# Patient Record
Sex: Male | Born: 1980 | Race: White | Hispanic: No | Marital: Single | Smoking: Never smoker
Health system: Southern US, Community
[De-identification: ages and names within clinical notes are randomized; demographics above are authoritative.]

---

## 2019-07-03 ENCOUNTER — Ambulatory Visit (INDEPENDENT_AMBULATORY_CARE_PROVIDER_SITE_OTHER): Payer: BLUE CROSS/BLUE SHIELD

## 2019-07-03 ENCOUNTER — Other Ambulatory Visit: Payer: Self-pay

## 2019-07-03 ENCOUNTER — Ambulatory Visit: Payer: BLUE CROSS/BLUE SHIELD | Admitting: Podiatry

## 2019-07-03 DIAGNOSIS — M2142 Flat foot [pes planus] (acquired), left foot: Secondary | ICD-10-CM | POA: Diagnosis not present

## 2019-07-03 DIAGNOSIS — M2141 Flat foot [pes planus] (acquired), right foot: Secondary | ICD-10-CM

## 2019-07-03 DIAGNOSIS — M779 Enthesopathy, unspecified: Secondary | ICD-10-CM

## 2019-07-03 DIAGNOSIS — M7752 Other enthesopathy of left foot: Secondary | ICD-10-CM | POA: Diagnosis not present

## 2019-07-05 NOTE — Progress Notes (Signed)
Subjective:   Patient ID: Adam Garza, male   DOB: 38 y.o.   MRN: [id number]   HPI 38 year old male presents the office today for concerns of pain to the left foot which is been ongoing since July.  He denies any recent injury to the area and the pain is intermittent.  He states he is quite.  Not sure if that is contributing to his pain.  It did seem that the pain started when he was squatting more while working on the house.  He states that shoes make it feel better.  Walking on hardwood floors makes the pain worse.  No swelling.  No numbness or tingling or any radiating pain.  No other concerns.   Review of Systems  All other systems reviewed and are negative.  No past medical history on file.  No current outpatient medications on file.  Allergies  Allergen Reactions  . Penicillins     Had reaction and 38 years old, unknown reaction.      Objective:  Physical Exam   General: AAO x3, NAD  Dermatological: Skin is warm, dry and supple bilateral. Nails x 10 are well manicured; remaining integument appears unremarkable at this time. There are no open sores, no preulcerative lesions, no rash or signs of infection present.  Vascular: Dorsalis Pedis artery and Posterior Tibial artery pedal pulses are 2/4 bilateral with immedate capillary fill time. Pedal hair growth present. No varicosities and no lower extremity edema present bilateral. There is no pain with calf compression, swelling, warmth, erythema.   Neruologic: Grossly intact via light touch bilateral. Vibratory intact via tuning fork bilateral. Protective threshold with Semmes Wienstein monofilament intact to all pedal sites bilateral.   Musculoskeletal: There is a decrease in the arch upon weightbearing bilaterally with left side slightly worse than the right.  There is tenderness palpation on the dorsal aspect of the foot mostly on the Lisfranc joint along the second metatarsal cuneiform joint.  Small palpable bone spurs present.   There is no other areas of pinpoint tenderness.  Ankle, subtalar range of motion intact.  Muscular strength 5/5 in all groups tested bilateral.  Gait: Unassisted, Nonantalgic.       Assessment:   Capsulitis, tendinitis left foot likely due to flatfoot deformity     Plan:  -Treatment options discussed including all alternatives, risks, and complications -Etiology of symptoms were discussed -X-rays were obtained and reviewed with the patient. There is no evidence of acute fracture or stress fracture. -Steroid injection performed.  Skin was cleaned with alcohol and a mixture of 1 cc Kenalog 10, 0.5 cc of Marcaine plain and 0.5 cc of lidocaine plain was infiltrated on the midfoot on the left side without any complications over the area of maximal tenderness.  He tolerated well. -This measured orthotics today by Liliane Channel. Will check orthotic coverage at his request.   Return in about 3 weeks (around 07/24/2019).  Trula Slade DPM

## 2019-07-24 ENCOUNTER — Ambulatory Visit (INDEPENDENT_AMBULATORY_CARE_PROVIDER_SITE_OTHER): Payer: BLUE CROSS/BLUE SHIELD | Admitting: Orthotics

## 2019-07-24 ENCOUNTER — Other Ambulatory Visit: Payer: Self-pay

## 2019-07-24 DIAGNOSIS — M779 Enthesopathy, unspecified: Secondary | ICD-10-CM

## 2019-07-24 DIAGNOSIS — M2142 Flat foot [pes planus] (acquired), left foot: Secondary | ICD-10-CM | POA: Diagnosis not present

## 2019-07-24 DIAGNOSIS — M2141 Flat foot [pes planus] (acquired), right foot: Secondary | ICD-10-CM | POA: Diagnosis not present

## 2019-07-24 NOTE — Progress Notes (Signed)
Patient came in today to pick up custom made foot orthotics.  The goals were accomplished and the patient reported no dissatisfaction with said orthotics.  Patient was advised of breakin period and how to report any issues. 

## 2019-10-31 ENCOUNTER — Other Ambulatory Visit: Payer: Self-pay

## 2019-11-01 ENCOUNTER — Encounter: Payer: Self-pay | Admitting: Family Medicine

## 2019-11-01 ENCOUNTER — Ambulatory Visit (INDEPENDENT_AMBULATORY_CARE_PROVIDER_SITE_OTHER): Payer: BLUE CROSS/BLUE SHIELD | Admitting: Family Medicine

## 2019-11-01 VITALS — BP 100/74 | HR 71 | Temp 97.5°F | Ht 70.0 in | Wt 212.0 lb

## 2019-11-01 DIAGNOSIS — Z23 Encounter for immunization: Secondary | ICD-10-CM

## 2019-11-01 DIAGNOSIS — M5416 Radiculopathy, lumbar region: Secondary | ICD-10-CM | POA: Diagnosis not present

## 2019-11-01 DIAGNOSIS — F319 Bipolar disorder, unspecified: Secondary | ICD-10-CM | POA: Diagnosis not present

## 2019-11-01 MED ORDER — PREDNISONE 10 MG (21) PO TBPK: ORAL_TABLET | ORAL | 0 refills | Status: DC

## 2019-11-01 MED ORDER — KETOROLAC TROMETHAMINE 60 MG/2ML IM SOLN
60.0000 mg | Freq: Once | INTRAMUSCULAR | Status: AC
  Administered 2019-11-01: 09:00:00 60 mg via INTRAMUSCULAR

## 2019-11-01 MED ORDER — CYCLOBENZAPRINE HCL 10 MG PO TABS: 10.0000 mg | ORAL_TABLET | Freq: Three times a day (TID) | ORAL | 0 refills | Status: DC | PRN

## 2019-11-01 NOTE — Progress Notes (Signed)
New Patient Office Visit  Subjective:  Patient ID: Adam Garza, male    DOB: 1981-03-07  Age: 39 y.o. MRN: [id number]  CC:  Chief Complaint  Patient presents with  . Establish Care    c/o back pains that radiates to his left leg x 3 days.     HPI Adam Garza presents for evaluation and treatment of a 3-day history of severe lower back pain.  Pain is in the lower back and is currently radiating into his left buttock area.  It is painful for him to bear weight on his left leg.  He had had pain in his left calf with tingling.  That has since resolved.  This came about after he was dead lifting in his basement gym.  Denies changes in his bowel or bladder function.  Denies saddle paresthesias.  History of lower back strains in the past but have not been this severe.  He has been taking ibuprofen with minimal relief.  Significant past medical history stable bipolar 2 treated with Lamictal.  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  He is down here with his significant other who is also a professor.  He has been teaching remotely.  History reviewed. No pertinent past medical history.  History reviewed. No pertinent surgical history.  Family History  Problem Relation Age of Onset  . Cancer Mother   . Arthritis Paternal Grandmother     Social History   Socioeconomic History  . Marital status: Single    Spouse name: Not on file  . Number of children: Not on file  . Years of education: Not on file  . Highest education level: Not on file  Occupational History  . Not on file  Tobacco Use  . Smoking status: Never Smoker  . Smokeless tobacco: Never Used  Substance and Sexual Activity  . Alcohol use: Not Currently    Comment: social  . Drug use: Never  . Sexual activity: Yes  Other Topics Concern  . Not on file  Social History Narrative  . Not on file   Social Determinants of Health   Financial Resource Strain:   . Difficulty of Paying Living Expenses: Not  on file  Food Insecurity:   . Worried About Programme researcher, broadcasting/film/video in the Last Year: Not on file  . Ran Out of Food in the Last Year: Not on file  Transportation Needs:   . Lack of Transportation (Medical): Not on file  . Lack of Transportation (Non-Medical): Not on file  Physical Activity:   . Days of Exercise per Week: Not on file  . Minutes of Exercise per Session: Not on file  Stress:   . Feeling of Stress : Not on file  Social Connections:   . Frequency of Communication with Friends and Family: Not on file  . Frequency of Social Gatherings with Friends and Family: Not on file  . Attends Religious Services: Not on file  . Active Member of Clubs or Organizations: Not on file  . Attends Banker Meetings: Not on file  . Marital Status: Not on file  Intimate Partner Violence:   . Fear of Current or Ex-Partner: Not on file  . Emotionally Abused: Not on file  . Physically Abused: Not on file  . Sexually Abused: Not on file    ROS Review of Systems  Constitutional: Negative.   Respiratory: Negative.   Cardiovascular: Negative.   Gastrointestinal: Negative.   Genitourinary: Negative.  Musculoskeletal: Positive for back pain and myalgias.  Skin: Negative.   Allergic/Immunologic: Negative for immunocompromised state.  Neurological: Negative for weakness and numbness.  Psychiatric/Behavioral: Negative.     Objective:   Today's Vitals: BP 100/74   Pulse 71   Temp (!) 97.5 F (36.4 C) (Oral)   Ht 5\' 10"  (1.778 m)   Wt 212 lb (96.2 kg)   SpO2 98%   BMI 30.42 kg/m   Physical Exam Vitals and nursing note reviewed.  Constitutional:      General: He is not in acute distress.    Appearance: Normal appearance. He is normal weight. He is not ill-appearing, toxic-appearing or diaphoretic.  HENT:     Head: Normocephalic and atraumatic.     Right Ear: External ear normal.     Left Ear: External ear normal.  Eyes:     General:        Right eye: No discharge.         Left eye: No discharge.     Conjunctiva/sclera: Conjunctivae normal.  Pulmonary:     Effort: Pulmonary effort is normal.  Musculoskeletal:     Lumbar back: No deformity or bony tenderness. Decreased range of motion. Positive left straight leg raise test (pain in lower back with slr). Negative right straight leg raise test.  Skin:    General: Skin is warm and dry.  Neurological:     Mental Status: He is alert and oriented to person, place, and time.     Motor: Motor function is intact.     Deep Tendon Reflexes:     Reflex Scores:      Patellar reflexes are 1+ on the right side and 1+ on the left side.      Achilles reflexes are 1+ on the right side and 1+ on the left side.    Comments: Mild sciatic notch ttp.   Psychiatric:        Mood and Affect: Mood normal.        Behavior: Behavior normal.     Assessment & Plan:   Problem List Items Addressed This Visit      Nervous and Auditory   Lumbar radiculopathy, acute - Primary   Relevant Medications   lamoTRIgine (LAMICTAL) 150 MG tablet   ketorolac (TORADOL) injection 60 mg (Start on 11/01/2019  9:15 AM)   predniSONE (STERAPRED UNI-PAK 21 TAB) 10 MG (21) TBPK tablet   cyclobenzaprine (FLEXERIL) 10 MG tablet     Other   Bipolar disorder [facility])      Outpatient Encounter Medications as of 11/01/2019  Medication Sig  . cyclobenzaprine (FLEXERIL) 10 MG tablet Take 1 tablet (10 mg total) by mouth 3 (three) times daily as needed for muscle spasms.  Marland Kitchen lamoTRIgine (LAMICTAL) 150 MG tablet Take 150 mg by mouth daily.  . predniSONE (STERAPRED UNI-PAK 21 TAB) 10 MG (21) TBPK tablet Take 6 today, 5 tomorrow, 4 the next day and then 3, 2, 1 and stop   Facility-Administered Encounter Medications as of 11/01/2019  Medication  . ketorolac (TORADOL) injection 60 mg    Follow-up: Return in about 1 week (around 11/08/2019), or if symptoms worsen or fail to improve.   Libby Maw, MD

## 2019-11-01 NOTE — Patient Instructions (Signed)
Radicular Pain Radicular pain is a type of pain that spreads from your back or neck along a spinal nerve. Spinal nerves are nerves that leave the spinal cord and go to the muscles. Radicular pain is sometimes called radiculopathy, radiculitis, or a pinched nerve. When you have this type of pain, you may also have weakness, numbness, or tingling in the area of your body that is supplied by the nerve. The pain may feel sharp and burning. Depending on which spinal nerve is affected, the pain may occur in the:  Neck area (cervical radicular pain). You may also feel pain, numbness, weakness, or tingling in the arms.  Mid-spine area (thoracic radicular pain). You would feel this pain in the back and chest. This type is rare.  Lower back area (lumbar radicular pain). You would feel this pain as low back pain. You may feel pain, numbness, weakness, or tingling in the buttocks or legs. Sciatica is a type of lumbar radicular pain that shoots down the back of the leg. Radicular pain occurs when one of the spinal nerves becomes irritated or squeezed (compressed). It is often caused by something pushing on a spinal nerve, such as one of the bones of the spine (vertebrae) or one of the round cushions between vertebrae (intervertebral disks). This can result from:  An injury.  Wear and tear or aging of a disk.  The growth of a bone spur that pushes on the nerve. Radicular pain often goes away when you follow instructions from your health care provider for relieving pain at home. Follow these instructions at home: Managing pain      If directed, put ice on the affected area: ? Put ice in a plastic bag. ? Place a towel between your skin and the bag. ? Leave the ice on for 20 minutes, 2-3 times a day.  If directed, apply heat to the affected area as often as told by your health care provider. Use the heat source that your health care provider recommends, such as a moist heat pack or a heating pad. ? Place  a towel between your skin and the heat source. ? Leave the heat on for 20-30 minutes. ? Remove the heat if your skin turns bright red. This is especially important if you are unable to feel pain, heat, or cold. You may have a greater risk of getting burned. Activity   Do not sit or rest in bed for long periods of time.  Try to stay as active as possible. Ask your health care provider what type of exercise or activity is best for you.  Avoid activities that make your pain worse, such as bending and lifting.  Do not lift anything that is heavier than 10 lb (4.5 kg), or the limit that you are told, until your health care provider says that it is safe.  Practice using proper technique when lifting items. Proper lifting technique involves bending your knees and rising up.  Do strength and range-of-motion exercises only as told by your health care provider or physical therapist. General instructions  Take over-the-counter and prescription medicines only as told by your health care provider.  Pay attention to any changes in your symptoms.  Keep all follow-up visits as told by your health care provider. This is important. ? Your health care provider may send you to a physical therapist to help with this pain. Contact a health care provider if:  Your pain and other symptoms get worse.  Your pain medicine is not   helping.  Your pain has not improved after a few weeks of home care.  You have a fever. Get help right away if:  You have severe pain, weakness, or numbness.  You have difficulty with bladder or bowel control. Summary  Radicular pain is a type of pain that spreads from your back or neck along a spinal nerve.  When you have radicular pain, you may also have weakness, numbness, or tingling in the area of your body that is supplied by the nerve.  The pain may feel sharp or burning.  Radicular pain may be treated with ice, heat, medicines, or physical therapy. This  information is not intended to replace advice given to you by your health care provider. Make sure you discuss any questions you have with your health care provider. Document Revised: 04/25/2018 Document Reviewed: 04/25/2018 Elsevier Patient Education  [place] Nerve A pinched nerve is an injury that occurs when too much pressure is placed on a nerve. This pressure can cause pain, burning, and muscle weakness in places that the nerve supplies feeling to, such as an arm, hand, or leg, or the back or neck. If a nerve is severely pinched or has been pinched for a long time, permanent nerve damage can occur. What are the causes? This condition may be caused by:  A nerve passing through a narrow area between bones or other body structures.  Arthritis that causes bones to press on a nerve.  Loss of blood supply to a nerve.  A nerve being stretched from an injury.  A sudden injury with swelling.  Long-term wear on the nerve.  Age-related changes in the spine. What are the signs or symptoms? The most common symptoms of a pinched nerve are feeling a tingling sensation and numbness. Other symptoms include:  Pain that spreads from one area of the body part to another.  A burning feeling.  Muscle weakness. How is this diagnosed? This condition may be diagnosed based on:  A physical exam. During the exam, your health care provider will: ? Check for numbness and muscle weakness. ? Move affected body parts to test for pain.  X-rays to check for bone damage.  A MRI or CT scan to check for conditions that may be causing nerve damage.  A muscle test (electromyogram, EMG) to evaluate how your muscles and nerves communicate. How is this treated?     A pinched nerve is usually treated first by:  Resting the affected body area.  Using devices to help you move without pain (supportive or protective devices), such as a splint, brace, or neck collar. Other treatments  depend on your symptoms and the amount of nerve damage you have. Other treatments may include:  Medicines, such as: ? Injections of numbing medicine. ? NSAIDs. ? Pain medicines. ? Steroid medicines. These may be given as a pill or as an injection.  Physical therapy to relieve pain, maintain movement, and improve muscle strength.  Surgery. This may be done if other treatments do not work. Follow these instructions at home:      Take over-the-counter and prescription medicines only as told by your health care provider.  Wear supportive or protective devices as told by your health care provider.  Do physical therapy exercises as directed.  Ask your health care provider what activities are safe for you.  Rest as needed.  If directed, put ice on the affected area: ? Put ice in a plastic bag. ? Place a towel  between your skin and the bag. ? Leave the ice on for 20 minutes, 2-3 times a day.  If directed, apply heat to the affected area. Use the heat source that your health care provider recommends, such as a moist heat pack or a heating pad. ? Place a towel between your skin and the heat source. ? Leave the heat on for 20-30 minutes. ? Remove the heat if your skin turns bright red. This is especially important if you are unable to feel pain, heat, or cold. You may have a greater risk of getting burned.  Do not drive or use heavy machinery while taking prescription pain medicine.  Keep all follow-up visits as told by your health care provider. This is important. Contact a health care provider if:  Your condition does not improve with treatment.  Your pain, numbness, or weakness suddenly gets worse. Get help right away if you:  Have loss of bladder control (urinary incontinence) or you cannot urinate.  Cannot control bowel movements (fecal incontinence).  Have new weakness in your arms or legs. Summary  A pinched nerve is an injury that occurs when too much pressure is  placed on a nerve.  This pressure can cause pain, burning, and muscle weakness in places that the nerve supplies feeling to, such as an arm, hand, or leg, or the back or neck.  Take over-the-counter and prescription medicines only as told by your health care provider.  Ask your health care provider what activities are safe for you while you are having symptoms. This information is not intended to replace advice given to you by your health care provider. Make sure you discuss any questions you have with your health care provider. Document Revised: 10/28/2017 Document Reviewed: 10/25/2017 Elsevier Patient Education  2020 [employer].

## 2019-11-02 ENCOUNTER — Telehealth: Payer: Self-pay | Admitting: Family Medicine

## 2019-11-02 DIAGNOSIS — M5416 Radiculopathy, lumbar region: Secondary | ICD-10-CM

## 2019-11-02 MED ORDER — TRAMADOL HCL 50 MG PO TABS: 50.0000 mg | ORAL_TABLET | Freq: Three times a day (TID) | ORAL | 0 refills | Status: AC | PRN

## 2019-11-02 NOTE — Addendum Note (Signed)
Addended by: Andrez Grime on: 11/02/2019 09:49 AM   Modules accepted: Orders

## 2019-11-02 NOTE — Telephone Encounter (Signed)
Dr. Kremer, please advise 

## 2019-11-02 NOTE — Telephone Encounter (Signed)
Patient was prescribed medication for back pain and received and injection yesterday. Patient wife stated that patient is still in extreme pain and wants to know how long does it for medication to work.

## 2019-11-02 NOTE — Telephone Encounter (Signed)
Pt aware that Rx sent in and will go pick it up, Will call back if no improvement.

## 2019-11-06 ENCOUNTER — Telehealth: Payer: Self-pay | Admitting: Family Medicine

## 2019-11-06 DIAGNOSIS — M5416 Radiculopathy, lumbar region: Secondary | ICD-10-CM

## 2019-11-06 MED ORDER — HYDROCODONE-ACETAMINOPHEN 5-325 MG PO TABS: 1.0000 | ORAL_TABLET | Freq: Four times a day (QID) | ORAL | 0 refills | Status: DC | PRN

## 2019-11-06 NOTE — Telephone Encounter (Signed)
Have sent a stronger pain med to the pharmacy for him. Yes, I would like to see him next week. If this med doesn't help him, I would want for him to go to the emergency room.

## 2019-11-06 NOTE — Telephone Encounter (Signed)
Spoke patient who states that his pain seems to be subsiding a little but he still having extreme pain in his leg to where he can barley walk. Patient wanting to know if he could have a few more days of pain medication and and reschedule his appointment to one day next week? Please advise.

## 2019-11-06 NOTE — Telephone Encounter (Signed)
Patient is calling to refill on all the meds for his pain. Patient stated that there has been no change and wants to know if it is okay to rescheduled his appointment for next week. Please call patient at [phone].

## 2019-11-07 NOTE — Telephone Encounter (Signed)
Pt aware of message and have rescheduled appt to next week.

## 2019-11-08 ENCOUNTER — Ambulatory Visit: Payer: BLUE CROSS/BLUE SHIELD | Admitting: Family Medicine

## 2019-11-12 ENCOUNTER — Other Ambulatory Visit: Payer: Self-pay

## 2019-11-13 ENCOUNTER — Ambulatory Visit (INDEPENDENT_AMBULATORY_CARE_PROVIDER_SITE_OTHER): Payer: BLUE CROSS/BLUE SHIELD | Admitting: Family Medicine

## 2019-11-13 ENCOUNTER — Encounter: Payer: Self-pay | Admitting: Family Medicine

## 2019-11-13 VITALS — BP 118/78 | HR 119 | Temp 97.0°F | Ht 70.0 in | Wt 222.2 lb

## 2019-11-13 DIAGNOSIS — M5126 Other intervertebral disc displacement, lumbar region: Secondary | ICD-10-CM

## 2019-11-13 DIAGNOSIS — M5416 Radiculopathy, lumbar region: Secondary | ICD-10-CM | POA: Diagnosis not present

## 2019-11-13 MED ORDER — HYDROCODONE-ACETAMINOPHEN 5-325 MG PO TABS: 1.0000 | ORAL_TABLET | Freq: Four times a day (QID) | ORAL | 0 refills | Status: DC | PRN

## 2019-11-13 MED ORDER — PREDNISONE 10 MG (48) PO TBPK: ORAL_TABLET | ORAL | 0 refills | Status: DC

## 2019-11-13 MED ORDER — CYCLOBENZAPRINE HCL 10 MG PO TABS: 10.0000 mg | ORAL_TABLET | Freq: Every day | ORAL | 0 refills | Status: DC

## 2019-11-13 NOTE — Progress Notes (Signed)
Established Patient Office Visit  Subjective:  Patient ID: Adam Garza, male    DOB: 1981-08-09  Age: 39 y.o. MRN: [id number]  CC:  Chief Complaint  Patient presents with  . Follow-up    f/u on leg and back pain pt states that the pain have not gone completely let leg feeling numb somtimes.     HPI Adam Garza presents for follow-up of his left lumbar radiculopathy.  Somewhat improved but there remains significant symptoms.  Continues to have deep pain in the lower leg and paresthesias in the left lateral leg area.  Lower back pain is somewhat decreased but is sensitive to any type of movement through the lower back.  Continues to deny saddle paresthesias and changes in his bowel or bladder function.  Hydrocodone was quite helpful and did not cause drowsiness.  All TRAM cause drowsiness.  He tolerated the 6-day Dosepak of prednisone well.  Cyclobenzaprine helps him sleep at night.  No past medical history on file.  No past surgical history on file.  Family History  Problem Relation Age of Onset  . Cancer Mother   . Arthritis Paternal Grandmother     Social History   Socioeconomic History  . Marital status: Single    Spouse name: Not on file  . Number of children: Not on file  . Years of education: Not on file  . Highest education level: Not on file  Occupational History  . Not on file  Tobacco Use  . Smoking status: Never Smoker  . Smokeless tobacco: Never Used  Substance and Sexual Activity  . Alcohol use: Not Currently    Comment: social  . Drug use: Never  . Sexual activity: Yes  Other Topics Concern  . Not on file  Social History Narrative  . Not on file   Social Determinants of Health   Financial Resource Strain:   . Difficulty of Paying Living Expenses: Not on file  Food Insecurity:   . Worried About Charity fundraiser in the Last Year: Not on file  . Ran Out of Food in the Last Year: Not on file  Transportation Needs:   . Lack of Transportation  (Medical): Not on file  . Lack of Transportation (Non-Medical): Not on file  Physical Activity:   . Days of Exercise per Week: Not on file  . Minutes of Exercise per Session: Not on file  Stress:   . Feeling of Stress : Not on file  Social Connections:   . Frequency of Communication with Friends and Family: Not on file  . Frequency of Social Gatherings with Friends and Family: Not on file  . Attends Religious Services: Not on file  . Active Member of Clubs or Organizations: Not on file  . Attends Archivist Meetings: Not on file  . Marital Status: Not on file  Intimate Partner Violence:   . Fear of Current or Ex-Partner: Not on file  . Emotionally Abused: Not on file  . Physically Abused: Not on file  . Sexually Abused: Not on file    Outpatient Medications Prior to Visit  Medication Sig Dispense Refill  . lamoTRIgine (LAMICTAL) 150 MG tablet Take 150 mg by mouth daily.    . cyclobenzaprine (FLEXERIL) 10 MG tablet Take 1 tablet (10 mg total) by mouth 3 (three) times daily as needed for muscle spasms. (Patient not taking: Reported on 11/13/2019) 30 tablet 0  . HYDROcodone-acetaminophen (NORCO) 5-325 MG tablet Take 1 tablet by mouth every 6 (  six) hours as needed for moderate pain. (Patient not taking: Reported on 11/13/2019) 20 tablet 0  . predniSONE (STERAPRED UNI-PAK 21 TAB) 10 MG (21) TBPK tablet Take 6 today, 5 tomorrow, 4 the next day and then 3, 2, 1 and stop (Patient not taking: Reported on 11/13/2019) 21 tablet 0   No facility-administered medications prior to visit.    Allergies  Allergen Reactions  . Penicillins     Had reaction and 39 years old, unknown reaction.    ROS Review of Systems  Constitutional: Negative for chills, diaphoresis, fatigue, fever and unexpected weight change.  Respiratory: Negative.   Cardiovascular: Negative.   Gastrointestinal: Negative.  Negative for constipation and diarrhea.  Genitourinary: Negative for difficulty urinating,  frequency and urgency.  Musculoskeletal: Positive for back pain and myalgias.  Neurological: Positive for numbness. Negative for weakness.  Hematological: Negative.   Psychiatric/Behavioral: Negative.       Objective:    Physical Exam  Constitutional: He is oriented to person, place, and time. He appears well-developed and well-nourished. No distress.  HENT:  Head: Atraumatic.  Right Ear: External ear normal.  Left Ear: External ear normal.  Eyes: Right eye exhibits no discharge. Left eye exhibits no discharge.  Neck: No JVD present. No tracheal deviation present.  Pulmonary/Chest: Effort normal. No stridor.  Neurological: He is alert and oriented to person, place, and time.  Skin: He is not diaphoretic.  Psychiatric: He has a normal mood and affect. His behavior is normal.    BP 118/78   Pulse (!) 119   Temp (!) 97 F (36.1 C) (Tympanic)   Ht 5' 10"  (1.778 m)   Wt 222 lb 3.2 oz (100.8 kg)   SpO2 98%   BMI 31.88 kg/m  Wt Readings from Last 3 Encounters:  11/13/19 222 lb 3.2 oz (100.8 kg)  11/01/19 212 lb (96.2 kg)     Health Maintenance Due  Topic Date Due  . HIV Screening  02/03/1996  . TETANUS/TDAP  02/03/2000    There are no preventive care reminders to display for this patient.  No results found for: TSH No results found for: WBC, HGB, HCT, MCV, PLT No results found for: NA, K, CHLORIDE, CO2, GLUCOSE, BUN, CREATININE, BILITOT, ALKPHOS, AST, ALT, PROT, ALBUMIN, CALCIUM, ANIONGAP, EGFR, GFR No results found for: CHOL No results found for: HDL No results found for: LDLCALC No results found for: TRIG No results found for: CHOLHDL No results found for: HGBA1C    Assessment & Plan:   Problem List Items Addressed This Visit      Nervous and Auditory   Lumbar radiculopathy, acute - Primary   Relevant Medications   HYDROcodone-acetaminophen (NORCO) 5-325 MG tablet   cyclobenzaprine (FLEXERIL) 10 MG tablet   predniSONE (STERAPRED UNI-PAK 48 TAB) 10 MG (48)  TBPK tablet   Other Relevant Orders   Ambulatory referral to Sports Medicine   MR Lumbar Spine Wo Contrast     Musculoskeletal and Integument   HNP (herniated nucleus pulposus), lumbar   Relevant Medications   HYDROcodone-acetaminophen (NORCO) 5-325 MG tablet   cyclobenzaprine (FLEXERIL) 10 MG tablet   predniSONE (STERAPRED UNI-PAK 48 TAB) 10 MG (48) TBPK tablet   Other Relevant Orders   Ambulatory referral to Sports Medicine   MR Lumbar Spine Wo Contrast      Meds ordered this encounter  Medications  . HYDROcodone-acetaminophen (NORCO) 5-325 MG tablet    Sig: Take 1 tablet by mouth every 6 (six) hours as needed for moderate  pain.    Dispense:  30 tablet    Refill:  0  . cyclobenzaprine (FLEXERIL) 10 MG tablet    Sig: Take 1 tablet (10 mg total) by mouth at bedtime.    Dispense:  30 tablet    Refill:  0  . predniSONE (STERAPRED UNI-PAK 48 TAB) 10 MG (48) TBPK tablet    Sig: Pharm to instruct 12 day dose pack.    Dispense:  48 tablet    Refill:  0    Follow-up: Return in about 3 weeks (around 12/04/2019).   He will start a 12-day Dosepak.  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  Continue Flexeril at night.  Hydrocodone as needed. Libby Maw, MD

## 2019-11-19 ENCOUNTER — Ambulatory Visit (INDEPENDENT_AMBULATORY_CARE_PROVIDER_SITE_OTHER): Payer: BLUE CROSS/BLUE SHIELD

## 2019-11-19 ENCOUNTER — Ambulatory Visit: Payer: BLUE CROSS/BLUE SHIELD | Admitting: Family Medicine

## 2019-11-19 ENCOUNTER — Encounter: Payer: Self-pay | Admitting: Family Medicine

## 2019-11-19 ENCOUNTER — Other Ambulatory Visit: Payer: Self-pay

## 2019-11-19 VITALS — BP 130/88 | HR 113 | Ht 70.0 in | Wt 226.0 lb

## 2019-11-19 DIAGNOSIS — M5126 Other intervertebral disc displacement, lumbar region: Secondary | ICD-10-CM

## 2019-11-19 DIAGNOSIS — M5416 Radiculopathy, lumbar region: Secondary | ICD-10-CM | POA: Diagnosis not present

## 2019-11-19 DIAGNOSIS — M5442 Lumbago with sciatica, left side: Secondary | ICD-10-CM | POA: Diagnosis not present

## 2019-11-19 MED ORDER — GABAPENTIN 300 MG PO CAPS: ORAL_CAPSULE | ORAL | 3 refills | Status: DC

## 2019-11-19 MED ORDER — HYDROCODONE-ACETAMINOPHEN 5-325 MG PO TABS: 1.0000 | ORAL_TABLET | Freq: Four times a day (QID) | ORAL | 0 refills | Status: DC | PRN

## 2019-11-19 NOTE — Patient Instructions (Addendum)
Thank you for coming in today. Get xray now.  Attend PT.  Plan for MRI at [facility] as it will be faster.   Recheck after MRI.   Try using gabapentin.  Try to taper off of hydrocodone.    Radicular Pain Radicular pain is a type of pain that spreads from your back or neck along a spinal nerve. Spinal nerves are nerves that leave the spinal cord and go to the muscles. Radicular pain is sometimes called radiculopathy, radiculitis, or a pinched nerve. When you have this type of pain, you may also have weakness, numbness, or tingling in the area of your body that is supplied by the nerve. The pain may feel sharp and burning. Depending on which spinal nerve is affected, the pain may occur in the:  Neck area (cervical radicular pain). You may also feel pain, numbness, weakness, or tingling in the arms.  Mid-spine area (thoracic radicular pain). You would feel this pain in the back and chest. This type is rare.  Lower back area (lumbar radicular pain). You would feel this pain as low back pain. You may feel pain, numbness, weakness, or tingling in the buttocks or legs. Sciatica is a type of lumbar radicular pain that shoots down the back of the leg. Radicular pain occurs when one of the spinal nerves becomes irritated or squeezed (compressed). It is often caused by something pushing on a spinal nerve, such as one of the bones of the spine (vertebrae) or one of the round cushions between vertebrae (intervertebral disks). This can result from:  An injury.  Wear and tear or aging of a disk.  The growth of a bone spur that pushes on the nerve. Radicular pain often goes away when you follow instructions from your health care provider for relieving pain at home. Follow these instructions at home: Managing pain      If directed, put ice on the affected area: ? Put ice in a plastic bag. ? Place a towel between your skin and the bag. ? Leave the ice on for 20 minutes, 2-3 times a  day.  If directed, apply heat to the affected area as often as told by your health care provider. Use the heat source that your health care provider recommends, such as a moist heat pack or a heating pad. ? Place a towel between your skin and the heat source. ? Leave the heat on for 20-30 minutes. ? Remove the heat if your skin turns bright red. This is especially important if you are unable to feel pain, heat, or cold. You may have a greater risk of getting burned. Activity   Do not sit or rest in bed for long periods of time.  Try to stay as active as possible. Ask your health care provider what type of exercise or activity is best for you.  Avoid activities that make your pain worse, such as bending and lifting.  Do not lift anything that is heavier than 10 lb (4.5 kg), or the limit that you are told, until your health care provider says that it is safe.  Practice using proper technique when lifting items. Proper lifting technique involves bending your knees and rising up.  Do strength and range-of-motion exercises only as told by your health care provider or physical therapist. General instructions  Take over-the-counter and prescription medicines only as told by your health care provider.  Pay attention to any changes in your symptoms.  Keep all follow-up visits as told by  your health care provider. This is important. ? Your health care provider may send you to a physical therapist to help with this pain. Contact a health care provider if:  Your pain and other symptoms get worse.  Your pain medicine is not helping.  Your pain has not improved after a few weeks of home care.  You have a fever. Get help right away if:  You have severe pain, weakness, or numbness.  You have difficulty with bladder or bowel control. Summary  Radicular pain is a type of pain that spreads from your back or neck along a spinal nerve.  When you have radicular pain, you may also have  weakness, numbness, or tingling in the area of your body that is supplied by the nerve.  The pain may feel sharp or burning.  Radicular pain may be treated with ice, heat, medicines, or physical therapy. This information is not intended to replace advice given to you by your health care provider. Make sure you discuss any questions you have with your health care provider. Document Revised: 04/25/2018 Document Reviewed: 04/25/2018 Elsevier Patient Education  [place].

## 2019-11-19 NOTE — Progress Notes (Signed)
X-ray L-spine shows multilevel arthritis.  No fractures.

## 2019-11-19 NOTE — Progress Notes (Signed)
Subjective:    I'm seeing this patient as a consultation for:  Dr. Doreene Burke. Note will be routed back to referring provider/PCP.  CC: Low pack pain and L leg pain  I, Molly Weber, LAT, ATC, am serving as scribe for Dr. Clementeen Graham.  HPI: Pt is a 39 y/o male presenting w/ c/o low back pain and radiating pain into his L leg x 3 weeks.  He states that he had worked out that day and then had pain that started in his lower back that then started radiating into his L LE to his L lower leg.  He also notes paresthesias into the L LE at his L anterior lower leg.  He has tried several medications including a steroid dose pack, hydrocodone, cyclobenzaprine and tramadol.  He also reports some weakness in his L lower leg.  He has seen his PCP who referred him for a lumbar spine MRI.  Aggravating factors include L sidelying, laying supine and sometimes walking.  Since last seeing his PCP on 11/13/19, pt reports improvement in his symptoms particularly when taking the hydrocodone.  Aggravating factors:  Past medical history, Surgical history, Family history, Social history, Allergies, and medications have been entered into the medical record, reviewed.   Review of Systems: No new headache, visual changes, nausea, vomiting, diarrhea, constipation, dizziness, abdominal pain, skin rash, fevers, chills, night sweats, weight loss, swollen lymph nodes, body aches, joint swelling, muscle aches, chest pain, shortness of breath, mood changes, visual or auditory hallucinations.   Objective:    Vitals:   11/19/19 0919  BP: 130/88  Pulse: (!) 113  SpO2: 96%   General: Well Developed, well nourished, and in no acute distress.  Neuro/Psych: Alert and oriented x3, extra-ocular muscles intact, able to move all 4 extremities, sensation grossly intact. Skin: Warm and dry, no rashes noted.  Respiratory: Not using accessory muscles, speaking in full sentences, trachea midline.  Cardiovascular: Pulses palpable, no  extremity edema. Abdomen: Does not appear distended. MSK:  L-spine: Nontender to spinal midline.  Tender palpation left lumbar paraspinal musculature. Normal lumbar motion. Lower extremity strength intact throughout bilateral extremities with exception of foot dorsiflexion left side 4/5. Reflexes equal normal bilateral extremities. Sensation is intact throughout bilateral extremities. Normal gait.  Lab and Radiology Results  DG Lumbar Spine Complete  Result Date: 11/19/2019 CLINICAL DATA:  Low back pain with left leg pain and numbness for the past 3 weeks. No known injury. EXAM: LUMBAR SPINE - COMPLETE 4+ VIEW COMPARISON:  None. FINDINGS: There are 5 non rib-bearing lumbar type vertebral bodies. Straightening of the expected lumbar lordosis. No anterolisthesis. There is minimal (approximately 5 mm) of retrolisthesis of L5 upon S1. Lumbar vertebral body heights are preserved. 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 Limited visualization of the bilateral SI joints is normal. Large colonic stool burden without evidence of enteric obstruction. Regional soft tissues appear normal. 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 Electronically Signed   By: Simonne Come M.D.   On: 11/19/2019 11:12   I, Clementeen Graham, personally (independently) visualized and performed the interpretation of the images attached in this note.   Impression and Recommendations:    Assessment and Plan: 39 y.o. male with low back pain with left leg pain.  Concerning for lumbar radiculopathy at L4-L5.  Symptoms somewhat improved with steroid Dosepak and hydrocodone.  Agree with MRI given mild left leg weakness.  However waiting time at the MRI location in [place] is about 4 weeks.  Plan to reorder MRI to be done in [place] as it is going to be a bit faster.  Additionally will add physical therapy and gabapentin.   Hopefully this will improve his back pain and leg pain and reduce the amount of hydrocodone he needs to take.  Refill hydrocodone.  Recheck following MRI.  Precautions reviewed.Marland Kitchen  PDMP reviewed during this encounter. Orders Placed This Encounter  Procedures  . DG Lumbar Spine Complete    Standing Status:   Future    Number of Occurrences:   1    Standing Expiration Date:   01/16/2021    Order Specific Question:   Reason for Exam (SYMPTOM  OR DIAGNOSIS REQUIRED)    Answer:   eval back pain and left lumbar rad    Order Specific Question:   Preferred imaging location?    Answer:   [facility]    Order Specific Question:   Radiology Contrast Protocol - do NOT remove file path    Answer:   \\[email or web address]  . MR Lumbar Spine Wo Contrast    Standing Status:   Future    Standing Expiration Date:   01/16/2021    Order Specific Question:   What is the patient's sedation requirement?    Answer:   No Sedation    Order Specific Question:   Does the patient have a pacemaker or implanted devices?    Answer:   No    Order Specific Question:   Preferred imaging location?    Answer:   [facility] (table limit-350lbs)    Order Specific Question:   Radiology Contrast Protocol - do NOT remove file path    Answer:   \\[email or web address]  . Ambulatory referral to Physical Therapy    Referral Priority:   Routine    Referral Type:   Physical Medicine    Referral Reason:   Specialty Services Required    Requested Specialty:   Physical Therapy   Meds ordered this encounter  Medications  . gabapentin (NEURONTIN) 300 MG capsule    Sig: One tab PO qHS for a week, then BID for a week, then TID. May double weekly to a max of 3,600mg /day    Dispense:  180 capsule    Refill:  3  . HYDROcodone-acetaminophen (NORCO) 5-325 MG tablet    Sig: Take 1 tablet by mouth every 6 (six) hours as needed for moderate pain.    Dispense:  30  tablet    Refill:  0    Discussed warning signs or symptoms. Please see discharge instructions. Patient expresses understanding.   The above documentation has been reviewed and is accurate and complete Lynne Leader

## 2019-11-23 ENCOUNTER — Ambulatory Visit: Payer: BLUE CROSS/BLUE SHIELD | Attending: Internal Medicine

## 2019-11-23 DIAGNOSIS — Z20822 Contact with and (suspected) exposure to covid-19: Secondary | ICD-10-CM

## 2019-11-24 LAB — NOVEL CORONAVIRUS, NAA: SARS-CoV-2, NAA: NOT DETECTED

## 2019-11-26 ENCOUNTER — Telehealth: Payer: Self-pay

## 2019-11-26 DIAGNOSIS — M5126 Other intervertebral disc displacement, lumbar region: Secondary | ICD-10-CM

## 2019-11-26 DIAGNOSIS — M5416 Radiculopathy, lumbar region: Secondary | ICD-10-CM

## 2019-11-26 MED ORDER — PREGABALIN 75 MG PO CAPS: 75.0000 mg | ORAL_CAPSULE | Freq: Two times a day (BID) | ORAL | 0 refills | Status: DC

## 2019-11-26 MED ORDER — HYDROCODONE-ACETAMINOPHEN 5-325 MG PO TABS: 1.0000 | ORAL_TABLET | Freq: Four times a day (QID) | ORAL | 0 refills | Status: DC | PRN

## 2019-11-26 NOTE — Telephone Encounter (Signed)
Patient called and stated switched from hydrocodone to gabapentin and was doing the increasing at it was stated on bottle. The gabapentin would give patient a weird effect and would make him sleep all day. The pain is not any better states it is actually back to the way it was a week ago is not sure what he should do now, but is not wanting to take the gabapentin if it is going to make him sleep. Patient was given the Number for [facility] to call about the MRI as he has not gotten a call in regard yet

## 2019-11-26 NOTE — Telephone Encounter (Signed)
I called Linell back. Plan to switch to lyrica.  STOP gabapentin.  Norco refilled.  Will double check on pre-cert for MRI at [facility].

## 2019-11-26 NOTE — Addendum Note (Signed)
Addended by: Rodolph Bong on: 11/26/2019 11:11 AM   Modules accepted: Orders

## 2019-12-06 ENCOUNTER — Ambulatory Visit: Payer: BLUE CROSS/BLUE SHIELD | Admitting: Family Medicine

## 2019-12-08 ENCOUNTER — Ambulatory Visit (INDEPENDENT_AMBULATORY_CARE_PROVIDER_SITE_OTHER): Payer: BLUE CROSS/BLUE SHIELD

## 2019-12-08 ENCOUNTER — Other Ambulatory Visit: Payer: Self-pay

## 2019-12-08 DIAGNOSIS — M5442 Lumbago with sciatica, left side: Secondary | ICD-10-CM | POA: Diagnosis not present

## 2019-12-08 DIAGNOSIS — M5416 Radiculopathy, lumbar region: Secondary | ICD-10-CM

## 2019-12-10 ENCOUNTER — Telehealth: Payer: Self-pay | Admitting: Family Medicine

## 2019-12-10 NOTE — Progress Notes (Signed)
MRI L-spine shows bulging disc and pinched nerve most significantly at left L4.  This could explain the pain that you have been having.  Next step at this point would be epidural steroid injection into the back.  Please return to clinic to review results.  Would you like me to go ahead and order the injection now?

## 2019-12-10 NOTE — Telephone Encounter (Signed)
If not having pain or weakness then injection is not needed.

## 2019-12-10 NOTE — Telephone Encounter (Signed)
Patient called back regarding the MRI results. He said that he is no longer having any pain and did not know if he still needed to proceed with what Dr Denyse Amass recommended.

## 2019-12-10 NOTE — Telephone Encounter (Signed)
Called pt and scheduled him for L-spine MRI review appt tomorrow at 12:45 pm.

## 2019-12-11 ENCOUNTER — Ambulatory Visit (INDEPENDENT_AMBULATORY_CARE_PROVIDER_SITE_OTHER): Payer: BLUE CROSS/BLUE SHIELD | Admitting: Family Medicine

## 2019-12-11 ENCOUNTER — Other Ambulatory Visit: Payer: Self-pay

## 2019-12-11 ENCOUNTER — Encounter: Payer: Self-pay | Admitting: Family Medicine

## 2019-12-11 VITALS — BP 110/78 | HR 81 | Ht 70.0 in | Wt 230.6 lb

## 2019-12-11 DIAGNOSIS — M5126 Other intervertebral disc displacement, lumbar region: Secondary | ICD-10-CM | POA: Diagnosis not present

## 2019-12-11 NOTE — Patient Instructions (Signed)
Thank you for coming in today. Return as needed.  Keep me updated.  If this worseness again we can do the shot.

## 2019-12-11 NOTE — Progress Notes (Signed)
I, Wendy Poet, LAT, ATC, am serving as scribe for Dr. Lynne Leader.  Adam Garza is a 39 y.o. male who presents to [place] [facility] today for f/u of low back pain and L leg pain and for L-spine MRI review.  Pt was last seen by Dr. Georgina Snell on 11/19/19 w/ c/o LBP, L leg pain to the L lower leg and numbness/tingling into the L anterior lower leg.  L-spine MRI was ordered that the pt had on 12/08/19.  Since his last visit, pt reports that his low back pain and L leg pain has subsided.  He con't to have some change in sensation in his L thigh and L anterior lower leg.  Diagnostic testing: L-spine XR- 11/19/19; L-spine MRI- 12/08/19   Pertinent review of systems: No fevers or chills  Relevant historical information: History of bipolar   Exam:  BP 110/78 (BP Location: Left Arm, Patient Position: Sitting, Cuff Size: Large)   Pulse 81   Ht 5\' 10"  (1.778 m)   Wt 230 lb 9.6 oz (104.6 kg)   SpO2 97%   BMI 33.09 kg/m  General: Well Developed, well nourished, and in no acute distress.   MSK: L-spine: Normal motion.  Lower extremity strength is intact.  Sensation intact throughout bilateral lower extremity.    Lab and Radiology Results No results found for this or any previous visit (from the past 72 hour(s)). MR Lumbar Spine Wo Contrast  Result Date: 12/08/2019 CLINICAL DATA:  Lumbar radiculitis. Acute left-sided low back pain with left-sided sciatica. Lumbar radiculopathy, greater than 6 weeks. Additional history provided by scanning technologist: Acute left-sided low back pain, left leg weakness/pain/numbness for 1 month, possible weightlifting injury. EXAM: MRI LUMBAR SPINE WITHOUT CONTRAST TECHNIQUE: Multiplanar, multisequence MR imaging of the lumbar spine was performed. No intravenous contrast was administered. COMPARISON:  Lumbar spine radiographs 11/19/2019 FINDINGS: Segmentation:  5 lumbar vertebrae. Alignment: Straightening of the expected lumbar lordosis. Trace  L2-L3 retrolisthesis. 2 mm L3-L4 grade 1 retrolisthesis. 2 mm L5-S1 grade 1 retrolisthesis. Vertebrae: Vertebral body height is maintained. No marrow edema or suspicious osseous lesion. L1 vertebral body hemangioma. Conus medullaris and cauda equina: Conus extends to the L1 inferior endplate level. No signal abnormality within the visualized distal spinal cord. Paraspinal and other soft tissues: No abnormality identified within included portions of the abdomen/retroperitoneum. Paraspinal soft tissues within normal limits. Disc levels: Mild multilevel disc degeneration greatest at L2-L3, L4-L5 and L5-S1. T12-L1: No disc herniation. No significant canal or foraminal stenosis. L1-L2: No disc herniation. No significant canal or foraminal stenosis. L2-L3: Trace retrolisthesis. Minimal disc bulge with superimposed small right center disc protrusion. The disc protrusion contributes to mild right subarticular narrowing and mild relative narrowing of the central canal without frank nerve root impingement. No significant foraminal stenosis. L3-L4: Mild grade 1 retrolisthesis. Minimal disc bulge. Mild facet arthrosis. No significant spinal canal stenosis. Mild bilateral neural foraminal narrowing. L4-L5: Small disc bulge. Superimposed left subarticular/foraminal disc extrusion with mild cranial and caudal migration. Mild facet hypertrophy. The disc extrusion contributes to minimal left subarticular narrowing, possibly contacting the descending left L5 nerve root. Central canal patent. The disc extrusion also contributes to severe left L4-L5 foraminal stenosis with impingement of the exiting left L4 nerve root. Mild right neural foraminal narrowing. L5-S1: Mild grade 1 retrolisthesis. Small, shallow central disc protrusion. Mild facet hypertrophy. No significant subarticular or central canal stenosis, although the disc protrusion may contact the bilateral descending S1 nerve roots near midline. Mild bilateral  neural foraminal  narrowing. IMPRESSION: Lumbar spondylosis as outlined and most notably as follows. At L4-L5, small disc bulge. Superimposed left subarticular/foraminal disc extrusion with mild cranial caudal migration. Mild facet hypertrophy. The disc extrusion contributes to severe left foraminal stenosis with impingement of the exiting left L4 nerve root. Correlate for left L4 radiculopathy. The disc extrusion also contributes to minimal left subarticular narrowing with possible contact upon the descending left L5 nerve root. Mild right neural foraminal narrowing. At L5-S1, a small central disc protrusion may contact the bilateral descending S1 nerve roots near midline, although there is not significant subarticular or central canal stenosis. Mild multifactorial bilateral neural foraminal narrowing. At L2-L3, a small right center disc protrusion contributes to mild right subarticular narrowing and relative narrowing of the central canal without nerve root impingement. Electronically Signed   By: Jackey Loge DO   On: 12/08/2019 21:16    I, Clementeen Graham, personally (independently) visualized and performed the interpretation of the images attached in this note.    Assessment and Plan: 39 y.o. male with left lumbar radiculopathy at L4.  Significant symptom improvement since MRI was ordered.  Patient does have neuroforaminal stenosis and some compression at L4 nerve root seen on MRI.  Given his symptoms have dramatically improved no further treatment needed at this time.  Watchful waiting recheck as needed.  Would proceed with epidural steroid injection if symptoms return.   Total encounter time 20 minutes including charting time date of service.    Discussed warning signs or symptoms. Please see discharge instructions. Patient expresses understanding.   The above documentation has been reviewed and is accurate and complete Clementeen Graham

## 2019-12-19 ENCOUNTER — Ambulatory Visit: Payer: BLUE CROSS/BLUE SHIELD | Attending: Internal Medicine

## 2019-12-19 DIAGNOSIS — Z20822 Contact with and (suspected) exposure to covid-19: Secondary | ICD-10-CM

## 2019-12-20 LAB — NOVEL CORONAVIRUS, NAA: SARS-CoV-2, NAA: NOT DETECTED

## 2019-12-25 ENCOUNTER — Encounter: Payer: Self-pay | Admitting: Family Medicine

## 2019-12-25 DIAGNOSIS — M5126 Other intervertebral disc displacement, lumbar region: Secondary | ICD-10-CM

## 2019-12-25 DIAGNOSIS — M5416 Radiculopathy, lumbar region: Secondary | ICD-10-CM

## 2019-12-26 MED ORDER — HYDROCODONE-ACETAMINOPHEN 5-325 MG PO TABS: 1.0000 | ORAL_TABLET | Freq: Four times a day (QID) | ORAL | 0 refills | Status: DC | PRN

## 2020-01-01 ENCOUNTER — Other Ambulatory Visit: Payer: Self-pay

## 2020-01-01 ENCOUNTER — Encounter: Payer: Self-pay | Admitting: Physical Therapy

## 2020-01-01 ENCOUNTER — Ambulatory Visit: Payer: BLUE CROSS/BLUE SHIELD | Attending: Family Medicine | Admitting: Physical Therapy

## 2020-01-01 DIAGNOSIS — M6281 Muscle weakness (generalized): Secondary | ICD-10-CM | POA: Diagnosis present

## 2020-01-01 DIAGNOSIS — M5442 Lumbago with sciatica, left side: Secondary | ICD-10-CM

## 2020-01-01 NOTE — Patient Instructions (Signed)
Access Code: [email or web address]  URL: [email or web address]  Date: 01/01/2020  Prepared by: Rosana Hoes   Exercises Supine Bridge - 10 reps - 3 sets - 3 sec hold hold Sidelying Hip Abduction - 10 reps - 3 sets Bird Dog - 10 reps - 3 sets - 5 seconds hold Sit to Stand without Arm Support - 10 reps - 3 sets

## 2020-01-01 NOTE — Therapy (Signed)
[place], [place], [address] Phone: [phone]   Fax:  [phone]  Physical Therapy Evaluation  Patient Details  Name: Adam Garza MRN: [id number] Date of Birth: 12/18/1980 Referring Provider (PT): Gregor Hams, MD   Encounter Date: 01/01/2020  PT End of Session - 01/01/20 1056    Visit Number  1    Number of Visits  8    Date for PT Re-Evaluation  02/26/20    Authorization Type  BCBS    PT Start Time  1045    PT Stop Time  1130    PT Time Calculation (min)  45 min    Activity Tolerance  Patient tolerated treatment well    Behavior During Therapy  [facility] for tasks assessed/performed       History reviewed. No pertinent past medical history.  History reviewed. No pertinent surgical history.  There were no vitals filed for this visit.   Subjective Assessment - 01/01/20 1047    Subjective  Patient reports prior to pandemic he was working out a lot and then stopped and with that he feels he lost a lot of muscle and gained weight. In early January, he was cooking dinner and started to feel some pain in his lower back, then the next morning he could barely walk or straighten up. He felt this way for about 3 weeks and his pain was located in left sided lower back and down left leg, then his pain went away in February by using medication, but then came back when he stopped taking the medication. He feels his posture and family history of bad backs and lifting mechanics has contributed to his symptoms. 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 Currently he notes that he is not having much pain because he is back on the medication and he is scheduled for an epidural tomorrow.    Limitations  Walking;House hold  activities;Lifting;Standing    How long can you sit comfortably?  No limitation    How long can you stand comfortably?  10-15 minutes    How long can you walk comfortably?  10-15 minutes    Diagnostic tests  X-ray, MRI    Patient Stated Goals  Get rid of pain and get stronger to prevent future episodes    Currently in Pain?  Yes    Pain Score  2     Pain Location  Back    Pain Orientation  Lower    Pain Descriptors / Indicators  Aching    Pain Type  Acute pain    Pain Radiating Towards  Left hip and down leg, numbness of left shin    Pain Onset  More than a month ago    Pain Frequency  Intermittent    Aggravating Factors   Walking, standing, lifting, arching back    Pain Relieving Factors  Medication, sitting    Effect of Pain on Daily Activities  Patient is limited in most daily activity to avid aggravating lower back         [facility] PT Assessment - 01/01/20 0001      Assessment   Medical Diagnosis  Acute left low back pain with left sided radiculopathy    Referring Provider (PT)  Gregor Hams,  MD    Onset Date/Surgical Date  11/01/19   Early January 2021   Hand Dominance  Right    Next MD Visit  Epidural scheduled for tomorrow (01/02/2020)    Prior Therapy  None      Precautions   Precautions  None      Restrictions   Weight Bearing Restrictions  No      Balance Screen   Has the patient fallen in the past 6 months  No    Has the patient had a decrease in activity level because of a fear of falling?   No    Is the patient reluctant to leave their home because of a fear of falling?   No      Home Public house manager residence    Living Arrangements  Spouse/significant other      Prior Function   Level of Independence  Independent    Leisure  Publishing copy, working out, house work      Cognition   Overall Cognitive Status  Within Functional Limits for tasks assessed      Observation/Other Assessments   Observations  Patient appears in no  apparent distress    Focus on Therapeutic Outcomes (FOTO)   53% limitation      Sensation   Light Touch  Appears Intact      ROM / Strength   AROM / PROM / Strength  AROM;PROM;Strength      AROM   Overall AROM Comments  Patient reports left sided low back and left hip ache with all motion    AROM Assessment Site  Lumbar    Lumbar Flexion  75% - fingertips to mid shin    Lumbar Extension  50%    Lumbar - Right Side Bend  75% - fingertips to distal thigh    Lumbar - Left Side Bend  75% - fingertips to distal thigh    Lumbar - Right Rotation  75%    Lumbar - Left Rotation  75%      Strength   Strength Assessment Site  Hip;Knee;Ankle    Right/Left Hip  Right;Left    Right Hip Flexion  4/5    Right Hip Extension  4-/5    Right Hip ABduction  4-/5    Left Hip Flexion  4-/5    Left Hip Extension  4-/5    Left Hip ABduction  3+/5    Right/Left Knee  Right;Left    Right Knee Flexion  5/5    Right Knee Extension  5/5    Left Knee Flexion  4+/5    Left Knee Extension  4+/5    Right/Left Ankle  Right;Left    Right Ankle Dorsiflexion  5/5    Right Ankle Eversion  5/5    Left Ankle Dorsiflexion  5/5    Left Ankle Eversion  5/5      Flexibility   Soft Tissue Assessment /Muscle Length  yes    Hamstrings  Limited bilaterally - increased radicular symptoms on left at ~30 deg      Palpation   Palpation comment  Patient reports TTP to bilat lumbar paraspinals, left gluteal region      Special Tests    Special Tests  Lumbar    Lumbar Tests  Straight Leg Raise      Straight Leg Raise   Findings  Positive      Transfers   Transfers  Independent with all Transfers  Objective measurements completed on examination: See above findings.      [facility] Adult PT Treatment/Exercise - 01/01/20 0001      Exercises   Exercises  Lumbar      Lumbar Exercises: Seated   Sit to Stand  10 reps      Lumbar Exercises: Supine   Bridge  10 reps    Bridge Limitations   cued for gluteal and core engagement      Lumbar Exercises: Sidelying   Hip Abduction  10 reps      Lumbar Exercises: Quadruped   Opposite Arm/Leg Raise  10 reps;5 seconds    Opposite Arm/Leg Raise Limitations  cued to keep abdominals engaged to control lumbar motion             PT Education - 01/01/20 1056    Education Details  Exam findings, POC, HEP    Person(s) Educated  Patient    Methods  Explanation;Demonstration;Tactile cues;Verbal cues;Handout    Comprehension  Verbalized understanding;Returned demonstration;Verbal cues required;Tactile cues required;Need further instruction       PT Short Term Goals - 01/01/20 1339      PT SHORT TERM GOAL #1   Title  Patient will be I with initial HEP to reduce pain and progress in PT    Time  4    Period  Weeks    Status  New    Target Date  01/29/20      PT SHORT TERM GOAL #2   Title  Patient will be able to stand and walk >/= 15 with no increased pain or difficulty    Time  4    Period  Weeks    Status  New    Target Date  01/29/20      PT SHORT TERM GOAL #3   Title  Patient will exhibit proper lifting form using hip hinge technique to reduce risk of reinjury    Time  4    Period  Weeks    Status  New    Target Date  01/29/20        PT Long Term Goals - 01/01/20 1340      PT LONG TERM GOAL #1   Title  Patient will be I with final HEP to maintain progress in PT    Time  8    Period  Weeks    Status  New    Target Date  02/26/20      PT LONG TERM GOAL #2   Title  Patient will exhibit improve strength and ability to lift >/= 45 lbs with proper technique to be able to perform heavy household tasks    Time  8    Period  Weeks    Status  New    Target Date  02/26/20      PT LONG TERM GOAL #3   Title  Patient will be able to walk >/= 1 mile without increased pain or limitation    Time  8    Period  Weeks    Status  New    Target Date  02/26/20      PT LONG TERM GOAL #4   Title  Patient will report no  difficulty with performing usual hobbies such as guitar    Time  8    Period  Weeks    Status  New    Target Date  02/26/20      PT LONG TERM GOAL #5   Title  Patient will report improved functional level of </= 32% on FOTO    Time  8    Period  Weeks    Status  New    Target Date  02/26/20             Plan - 01/01/20 1057    Clinical Impression Statement  Patient presents to PT with report of acute low back and left leg pain. Overall his pain is minimal this visit compared to previous days from medication and he has epidural scheduled for tomorrow (01/02/2020) so symptoms may be different at next visit compared to today. He did not exhibit a clear directional preference his visit but did state more peripheralization with extension based movements. His LLE symptoms do seem to be radicular related pain. He exhibits limited lumbar range of motion with increased low back and left hip ache. He does exhibit core and hip weakness so he was provided exercises to initiate strengthening in order to improve luumbar control. He would benefit from continued skilled PT to progress strength and improve control to reduce pain and prevent future episodes of lower back pain.    Personal Factors and Comorbidities  Past/Current Experience;Fitness    Examination-Activity Limitations  Locomotion Level;Stand;Lift;Carry;Bend    Examination-Participation Restrictions  Cleaning;Meal Prep;Community Activity;Shop;Yard Work;Laundry    Stability/Clinical Decision Making  Evolving/Moderate complexity    Clinical Decision Making  Moderate    Rehab Potential  Good    PT Frequency  1x / week    PT Duration  8 weeks    PT Treatment/Interventions  Cryotherapy;Electrical Stimulation;Moist Heat;Traction;Neuromuscular re-education;Balance training;Therapeutic exercise;Therapeutic activities;Functional mobility training;Stair training;Gait training;Patient/family education;Manual techniques;Dry needling;Passive range of  motion;Taping;Spinal Manipulations;Joint Manipulations    PT Next Visit Plan  Assess HEP and progress PRN, core and hip strengthening, teach hip hinge and proper dead lift technique    PT Home Exercise Plan  Bridge, side lying hip abduction, bird dog, sit-to-stand    Consulted and Agree with Plan of Care  Patient       Patient will benefit from skilled therapeutic intervention in order to improve the following deficits and impairments:  Decreased range of motion, Decreased activity tolerance, Pain, Improper body mechanics, Postural dysfunction, Decreased strength  Visit Diagnosis: Acute left-sided low back pain with left-sided sciatica  Muscle weakness (generalized)     Problem List Patient Active Problem List   Diagnosis Date Noted  . HNP (herniated nucleus pulposus), lumbar 11/13/2019  . Lumbar radiculopathy, acute 11/01/2019  . Bipolar disorder (HCC) 11/01/2019    Rosana Hoes, PT, DPT, LAT, ATC 01/01/20  3:09 PM Phone: [phone] Fax: [phone]   [facility] [address] [place], [place], [address] Phone: [phone]   Fax:  [phone]  Name: Adam Garza MRN: [id number] Date of Birth: [birth year]

## 2020-01-02 ENCOUNTER — Ambulatory Visit
Admission: RE | Admit: 2020-01-02 | Discharge: 2020-01-02 | Disposition: A | Discharge status: Home / Self Care | Payer: BLUE CROSS/BLUE SHIELD | Source: Ambulatory Visit | Attending: Family Medicine | Admitting: Family Medicine

## 2020-01-02 DIAGNOSIS — M5416 Radiculopathy, lumbar region: Secondary | ICD-10-CM

## 2020-01-02 DIAGNOSIS — M5126 Other intervertebral disc displacement, lumbar region: Secondary | ICD-10-CM

## 2020-01-02 MED ORDER — METHYLPREDNISOLONE ACETATE 40 MG/ML INJ SUSP (RADIOLOG
120.0000 mg | Freq: Once | INTRAMUSCULAR | Status: AC
  Administered 2020-01-02: 120 mg via EPIDURAL

## 2020-01-02 MED ORDER — IOPAMIDOL (ISOVUE-M 200) INJECTION 41%
1.0000 mL | Freq: Once | INTRAMUSCULAR | Status: AC
  Administered 2020-01-02: 11:00:00 1 mL via EPIDURAL

## 2020-01-02 NOTE — Discharge Instructions (Signed)

## 2020-01-08 ENCOUNTER — Other Ambulatory Visit: Payer: Self-pay

## 2020-01-08 ENCOUNTER — Ambulatory Visit: Payer: BLUE CROSS/BLUE SHIELD | Admitting: Physical Therapy

## 2020-01-08 ENCOUNTER — Encounter: Payer: Self-pay | Admitting: Physical Therapy

## 2020-01-08 DIAGNOSIS — M5442 Lumbago with sciatica, left side: Secondary | ICD-10-CM | POA: Diagnosis not present

## 2020-01-08 DIAGNOSIS — M6281 Muscle weakness (generalized): Secondary | ICD-10-CM

## 2020-01-08 NOTE — Therapy (Signed)
[facility] [address] [place], [place], [address] Phone: [phone]   Fax:  [phone]  Physical Therapy Treatment  Patient Details  Name: Adam Garza MRN: [id number] Date of Birth: 10/01/1981 Referring Provider (PT): Rodolph Bong, MD   Encounter Date: 01/08/2020  PT End of Session - 01/08/20 0850    Visit Number  2    Number of Visits  8    Date for PT Re-Evaluation  02/26/20    Authorization Type  BCBS    PT Start Time  [phone]    PT Stop Time  0930    PT Time Calculation (min)  40 min    Activity Tolerance  Patient tolerated treatment well    Behavior During Therapy  [facility] for tasks assessed/performed       History reviewed. No pertinent past medical history.  History reviewed. No pertinent surgical history.  There were no vitals filed for this visit.  Subjective Assessment - 01/08/20 0851    Subjective  I really am feeling fine, so I haven t gone back to gym and this happened when I was lifting weights in home gym. so I am nervous about doing Deadlifts    Limitations  Walking;House hold activities;Lifting;Standing    Diagnostic tests  X-ray, MRI    Patient Stated Goals  Get rid of pain and get stronger to prevent future episodes    Currently in Pain?  No/denies    Pain Score  0-No pain    Pain Location  Back    Pain Orientation  Lower                       [facility] Adult PT Treatment/Exercise - 01/08/20 0001      Lumbar Exercises: Standing   Other Standing Lumbar Exercises  standing monster walks 1 x 20 feet to RT and To LT, standing extension x 3 15-20 sec.  prone press ups x 5,      Other Standing Lumbar Exercises  DL with #63 KB pt 6 x 5 DL with supervision and comment on technique. Pt has tendency to increase lever arm with lift form floor , hip hine with dowel 3 x 10 practice and then 2 x 10 practice to wall      Lumbar Exercises: Supine   Bridge  10 reps    Bridge Limitations  x 2 with 30#     Other Supine Lumbar Exercises  trunk rotation 5 x to RT and then to LT hold 15-20 sec      Lumbar Exercises: Sidelying   Hip Abduction  10 reps      Lumbar Exercises: Quadruped   Opposite Arm/Leg Raise  10 reps    Opposite Arm/Leg Raise Limitations  review of HEP              PT Education - 01/08/20 0926    Education Details  added to HEP for DL and hip hinge, hip strengthening    Person(s) Educated  Patient    Methods  Explanation;Demonstration;Tactile cues;Verbal cues;Handout    Comprehension  Verbalized understanding;Returned demonstration       PT Short Term Goals - 01/01/20 1339      PT SHORT TERM GOAL #1   Title  Patient will be I with initial HEP to reduce pain and progress in PT    Time  4    Period  Weeks    Status  New    Target Date  01/29/20      PT SHORT TERM GOAL #2   Title  Patient will be able to stand and walk >/= 15 with no increased pain or difficulty    Time  4    Period  Weeks    Status  New    Target Date  01/29/20      PT SHORT TERM GOAL #3   Title  Patient will exhibit proper lifting form using hip hinge technique to reduce risk of reinjury    Time  4    Period  Weeks    Status  New    Target Date  01/29/20        PT Long Term Goals - 01/01/20 1340      PT LONG TERM GOAL #1   Title  Patient will be I with final HEP to maintain progress in PT    Time  8    Period  Weeks    Status  New    Target Date  02/26/20      PT LONG TERM GOAL #2   Title  Patient will exhibit improve strength and ability to lift >/= 45 lbs with proper technique to be able to perform heavy household tasks    Time  8    Period  Weeks    Status  New    Target Date  02/26/20      PT LONG TERM GOAL #3   Title  Patient will be able to walk >/= 1 mile without increased pain or limitation    Time  8    Period  Weeks    Status  New    Target Date  02/26/20      PT LONG TERM GOAL #4   Title  Patient will report no difficulty with performing usual hobbies  such as guitar    Time  8    Period  Weeks    Status  New    Target Date  02/26/20      PT LONG TERM GOAL #5   Title  Patient will report improved functional level of </= 32% on FOTO    Time  8    Period  Weeks    Status  New    Target Date  02/26/20            Plan - 01/08/20 3220    Clinical Impression Statement  Ms Gorby returns to clinic with no pain in back but apprehension about continuing deadlifting and returning to home gym.  He plans to return to cycling next week. but would like to be more confident in using equipment pain and injury free. Pt tends to compensate with increased lever arm and arm movment with DL and was told to minimize space when lifting to decreased injury and pain with lifting heavier lift  Pt may also benefit from using more Sumo style DL style  Will continue POC    Examination-Activity Limitations  Locomotion Level;Stand;Lift;Carry;Bend    Examination-Participation Restrictions  Cleaning;Meal Prep;Community Activity;Shop;Yard Work;Laundry    Stability/Clinical Decision Making  Evolving/Moderate complexity    Clinical Decision Making  Moderate    Rehab Potential  Good    PT Frequency  1x / week    PT Duration  8 weeks    PT Next Visit Plan  Work on hip strenght and reinforce good body mechanics with DL and hip hinge    PT Home Exercise Plan  Bridge, side lying hip abduction, bird dog, sit-to-[email or web address]  Access Code: [email or web address]: [email or web address]: 03/16/2021Prepared by: Wayland Denis BeardsleyExercises  Standing Hip Hinge with Dowel - 1 x daily - 6 x weekly - 3 sets - 10 reps  Side Stepping with Resistance at Feet - 1 x daily - 7 x weekly - 3 sets - 10 reps  Kettlebell Deadlift - 1 x daily - 7 x weekly - 4 sets - 5 reps   Patient will benefit from skilled therapeutic intervention in order to improve the following deficits and impairments:  Decreased range of motion, Decreased activity tolerance, Pain, Improper body  mechanics, Postural dysfunction, Decreased strength  Visit Diagnosis: Acute left-sided low back pain with left-sided sciatica  Muscle weakness (generalized)     Problem List Patient Active Problem List   Diagnosis Date Noted  . HNP (herniated nucleus pulposus), lumbar 11/13/2019  . Lumbar radiculopathy, acute 11/01/2019  . Bipolar disorder (HCC) 11/01/2019    Garen Lah, PT Certified Exercise Expert for the Aging Adult  01/08/20 12:27 PM Phone: [phone] Fax: [phone]  [facility] [address] [place], [place], [address] Phone: [phone]   Fax:  [phone]  Name: Adam Garza MRN: [id number] Date of Birth: 1981/04/11

## 2020-01-08 NOTE — Patient Instructions (Addendum)
Posture Tips DO: - stand tall and erect - keep chin tucked in - keep head and shoulders in alignment - check posture regularly in mirror or large window - pull head back against headrest in car seat;  Change your position often.  Sit with lumbar support. DON'T: - slouch or slump while watching TV or reading - sit, stand or lie in one position  for too long;  Sitting is especially hard on the spine so if you sit at a desk/use the computer, then stand up often!   Copyright  VHI. All rights reserved.  Posture - Standing   Good posture is important. Avoid slouching and forward head thrust. Maintain curve in low back and align ears over shoul- ders, hips over ankles.  Pull your belly button in toward your back bone. Stand with even weight in both feet( great toe , little toe and heel (tripod)) belly to spine,  Ribs lifted up ( Golden thread to sky,show me your logo on shirt!) Chin down  Copyright  VHI. All rights reserved.  Posture - Sitting   Sit upright, head facing forward. Try using a roll to support lower back. Keep shoulders relaxed, and avoid rounded back. Keep hips level with knees. Avoid crossing legs for long periods. Sit on sit bones not tail bone  As shown in clinic  Use lumbar support at work  Copyright  VHI. All rights reserved.      IF you feel a twinge in your back, try to extend and calm down.  Pain free range, No pain down your leg etc. Back extension and trunk rotation pain free range    Extension   Lie face down, hands close to chest. Press trunk up, arching back. Keep neck long, shoulders down. Tighten buttocks to protect lower back. Hold _10___ seconds. Repeat __5__ times. Do __as needed__ sessions per day.  Backward Bend (Standing)   Arch backward to make hollow of back deeper. Hold _10___ seconds. Repeat _3___ times per set. Do ____ sets per session. Do ____ sessions per day.  Copyright  VHI. All rights reserved.     Knee Roll    Lying on back,  with knees bent and feet flat on floor, arms outstretched to sides, slowly roll both knees to side, hold 5 seconds. Back to starting position, hold 5 seconds. Then to opposite side, hold 5 seconds. Return to starting position. Keep shoulders and arms in contact with floor.   Copyright  VHI. All rights reserved.   Garen Lah, PT Certified Exercise Expert for the Aging Adult  01/08/20 9:25 AM Phone: [phone] Fax: [phone]

## 2020-01-15 ENCOUNTER — Encounter: Payer: Self-pay | Admitting: Physical Therapy

## 2020-01-15 ENCOUNTER — Other Ambulatory Visit: Payer: Self-pay

## 2020-01-15 ENCOUNTER — Ambulatory Visit: Payer: BLUE CROSS/BLUE SHIELD | Admitting: Physical Therapy

## 2020-01-15 DIAGNOSIS — M5442 Lumbago with sciatica, left side: Secondary | ICD-10-CM | POA: Diagnosis not present

## 2020-01-15 DIAGNOSIS — M6281 Muscle weakness (generalized): Secondary | ICD-10-CM

## 2020-01-15 NOTE — Therapy (Signed)
[facility] [address] [place], [place], [address] Phone: [phone]   Fax:  [phone]  Physical Therapy Treatment  Patient Details  Name: Adam Garza MRN: [id number] Date of Birth: 10/21/1981 Referring Provider (PT): Rodolph Bong, MD   Encounter Date: 01/15/2020  PT End of Session - 01/15/20 1359    Visit Number  3    Number of Visits  8    Date for PT Re-Evaluation  02/26/20    Authorization Type  BCBS    PT Start Time  1400    PT Stop Time  1440    PT Time Calculation (min)  40 min    Activity Tolerance  Patient tolerated treatment well    Behavior During Therapy  [facility] for tasks assessed/performed       History reviewed. No pertinent past medical history.  History reviewed. No pertinent surgical history.  There were no vitals filed for this visit.  Subjective Assessment - 01/15/20 1400    Subjective  Patient reports he is doing well at home. He states his back feels tired, not painful, and his left leg feels less stiff. He is going to get back into hiking and will begin riding pelaton soon.    Patient Stated Goals  Get rid of pain and get stronger to prevent future episodes    Currently in Pain?  No/denies                       [facility] Adult PT Treatment/Exercise - 01/15/20 0001      Exercises   Exercises  Lumbar      Lumbar Exercises: Aerobic   Recumbent Bike  L3 x 5 min      Lumbar Exercises: Standing   Other Standing Lumbar Exercises  Goblet squat with 15# 2x10    Other Standing Lumbar Exercises  Dead lift with 30# kettlebell x10, 45# kettlebell 2x10 - mirror used for visual feeedback to avoid shifting weight      Lumbar Exercises: Supine   Bridge  10 reps;3 seconds    Single Leg Bridge  10 reps;3 seconds      Lumbar Exercises: Sidelying   Other Sidelying Lumbar Exercises  Side plank on knees 15 sec x 3, with top hip abduction hold 15 sec x 2             PT Education -  01/15/20 1358    Education Details  HEP    Person(s) Educated  Patient    Methods  Explanation;Demonstration;Verbal cues    Comprehension  Verbalized understanding;Returned demonstration;Verbal cues required;Need further instruction       PT Short Term Goals - 01/01/20 1339      PT SHORT TERM GOAL #1   Title  Patient will be I with initial HEP to reduce pain and progress in PT    Time  4    Period  Weeks    Status  New    Target Date  01/29/20      PT SHORT TERM GOAL #2   Title  Patient will be able to stand and walk >/= 15 with no increased pain or difficulty    Time  4    Period  Weeks    Status  New    Target Date  01/29/20      PT SHORT TERM GOAL #3   Title  Patient will exhibit proper lifting form using hip hinge technique to reduce risk of reinjury  Time  4    Period  Weeks    Status  New    Target Date  01/29/20        PT Long Term Goals - 01/01/20 1340      PT LONG TERM GOAL #1   Title  Patient will be I with final HEP to maintain progress in PT    Time  8    Period  Weeks    Status  New    Target Date  02/26/20      PT LONG TERM GOAL #2   Title  Patient will exhibit improve strength and ability to lift >/= 45 lbs with proper technique to be able to perform heavy household tasks    Time  8    Period  Weeks    Status  New    Target Date  02/26/20      PT LONG TERM GOAL #3   Title  Patient will be able to walk >/= 1 mile without increased pain or limitation    Time  8    Period  Weeks    Status  New    Target Date  02/26/20      PT LONG TERM GOAL #4   Title  Patient will report no difficulty with performing usual hobbies such as guitar    Time  8    Period  Weeks    Status  New    Target Date  02/26/20      PT LONG TERM GOAL #5   Title  Patient will report improved functional level of </= 32% on FOTO    Time  8    Period  Weeks    Status  New    Target Date  02/26/20            Plan - 01/15/20 1359    Clinical Impression Statement   Patient continues to progress with core and hip strengthening without any report of increased pain level. He did require cueing to keep weight centered as he tended to shift weight toward right side, and re-instructed on hip hinge technique with keeping weight close. He would benefit from continued skilled PT to progress strength and improve control to reduce pain and prevent future episodes of lower back pain.    PT Treatment/Interventions  Cryotherapy;Electrical Stimulation;Moist Heat;Traction;Neuromuscular re-education;Balance training;Therapeutic exercise;Therapeutic activities;Functional mobility training;Stair training;Gait training;Patient/family education;Manual techniques;Dry needling;Passive range of motion;Taping;Spinal Manipulations;Joint Manipulations    PT Next Visit Plan  Work on hip strenght and reinforce good body mechanics with DL and hip hinge    PT Home Exercise Plan  SL bridge, side plank on knees with hip abduction, bird dog, goblet squat; [email or web address]: dead lift, lateral band walk, hip hinge with dowel    Consulted and Agree with Plan of Care  Patient       Patient will benefit from skilled therapeutic intervention in order to improve the following deficits and impairments:  Decreased range of motion, Decreased activity tolerance, Pain, Improper body mechanics, Postural dysfunction, Decreased strength  Visit Diagnosis: Acute left-sided low back pain with left-sided sciatica  Muscle weakness (generalized)     Problem List Patient Active Problem List   Diagnosis Date Noted  . HNP (herniated nucleus pulposus), lumbar 11/13/2019  . Lumbar radiculopathy, acute 11/01/2019  . Bipolar disorder (H[facility]) 11/01/2019    Rosana Hoes, PT, DPT, LAT, ATC 01/15/20  2:53 PM Phone: [phone] Fax: [phone]   [facility] [address]  [place], [place], [address] Phone: [phone]   Fax:  [phone]  Name: Adam Garza MRN: [id number] Date of Birth: Nov 24, 1980

## 2020-01-15 NOTE — Patient Instructions (Signed)
Access Code: [email or web address] URL: [email or web address] Date: 01/15/2020 Prepared by: Rosana Hoes  Exercises Bird Dog - 10 reps - 3 sets - 5 seconds hold Single Leg Bridge - 3 x weekly - 3 sets - 8-10 reps - 2 econds hold Modified Side Plank with Hip Abduction - 3 x weekly - 4 sets - 15-20 seconds hold Goblet Squat with Kettlebell - 3 x weekly - 3 sets - 10-15 reps

## 2020-01-22 ENCOUNTER — Ambulatory Visit: Payer: BLUE CROSS/BLUE SHIELD | Admitting: Physical Therapy

## 2020-01-29 ENCOUNTER — Other Ambulatory Visit: Payer: Self-pay

## 2020-01-29 ENCOUNTER — Encounter: Payer: Self-pay | Admitting: Physical Therapy

## 2020-01-29 ENCOUNTER — Ambulatory Visit: Payer: BLUE CROSS/BLUE SHIELD | Attending: Family Medicine | Admitting: Physical Therapy

## 2020-01-29 ENCOUNTER — Ambulatory Visit: Payer: BLUE CROSS/BLUE SHIELD | Admitting: Family Medicine

## 2020-01-29 DIAGNOSIS — M5442 Lumbago with sciatica, left side: Secondary | ICD-10-CM

## 2020-01-29 DIAGNOSIS — M6281 Muscle weakness (generalized): Secondary | ICD-10-CM

## 2020-01-29 NOTE — Therapy (Addendum)
[facility] [place], [place], [address] Phone: [phone]   Fax:  [phone]  Physical Therapy Treatment / Discharge  Patient Details  Name: Adam Garza MRN: [id number] Date of Birth: 02/08/1981 Referring Provider (PT): Gregor Hams, MD   Encounter Date: 01/29/2020    History reviewed. No pertinent past medical history.  History reviewed. No pertinent surgical history.  There were no vitals filed for this visit.                              PT Short Term Goals - 01/29/20 1140      PT SHORT TERM GOAL #1   Title  Patient will be I with initial HEP to reduce pain and progress in PT    Time  4    Period  Weeks    Status  Achieved    Target Date  01/29/20      PT SHORT TERM GOAL #2   Title  Patient will be able to stand and walk >/= 15 with no increased pain or difficulty    Time  4    Period  Weeks    Status  Achieved    Target Date  01/29/20      PT SHORT TERM GOAL #3   Title  Patient will exhibit proper lifting form using hip hinge technique to reduce risk of reinjury    Time  4    Period  Weeks    Status  Achieved    Target Date  01/29/20        PT Long Term Goals - 01/29/20 1141      PT LONG TERM GOAL #1   Title  Patient will be I with final HEP to maintain progress in PT    Time  8    Period  Weeks    Status  On-going    Target Date  02/26/20      PT LONG TERM GOAL #2   Title  Patient will exhibit improve strength and ability to lift >/= 45 lbs with proper technique to be able to perform heavy household tasks    Time  8    Period  Weeks    Status  Achieved      PT LONG TERM GOAL #3   Title  Patient will be able to walk >/= 1 mile without increased pain or limitation    Time  8    Period  Weeks    Status  Achieved      PT LONG TERM GOAL #4   Title  Patient will report no difficulty with performing usual hobbies such as guitar    Time  8    Period   Weeks    Status  Achieved      PT LONG TERM GOAL #5   Title  Patient will report improved functional level of </= 32% on FOTO    Time  8    Period  Weeks    Status  Achieved              Patient will benefit from skilled therapeutic intervention in order to improve the following deficits and impairments:  Decreased range of motion, Decreased activity tolerance, Pain, Improper body mechanics, Postural dysfunction, Decreased strength  Visit Diagnosis: Acute left-sided low back pain with left-sided sciatica  Muscle weakness (generalized)     Problem List Patient Active Problem List  Diagnosis Date Noted  . HNP (herniated nucleus pulposus), lumbar 11/13/2019  . Lumbar radiculopathy, acute 11/01/2019  . Bipolar disorder ([place]) 11/01/2019    PHYSICAL THERAPY DISCHARGE SUMMARY  Visits from Start of Care: 4  Current functional level related to goals / functional outcomes: See above   Remaining deficits: See above   Education / Equipment: HEP Plan: Patient agrees to discharge.  Patient goals were not met. Patient is being discharged due to not returning since the last visit.  ?????     Hilda Blades, PT, DPT, LAT, ATC 02/27/20  1:32 PM Phone: [phone] Fax: [facility] [address] [place], [place], [address] Phone: [phone]   Fax:  [phone]  Name: Shreyas Piatkowski MRN: [id number] Date of Birth: 1980/11/01

## 2020-01-29 NOTE — Patient Instructions (Signed)
Access Code: [email or web address] URL: [email or web address] Date: 01/29/2020 Prepared by: Rosana Hoes  Exercises Single Leg Bridge - 3-4 x weekly - 10 reps - 3 sets Modified Side Plank with Hip Abduction - 3-4 x weekly - 3 sets - 10 reps Bird Dog - 3-4 x weekly - 10 reps - 3 sets - 3 sec hold hold Goblet Squat with Kettlebell - 3-4 x weekly - 3 sets - 10 reps Standing Hip Hinge with Dowel - 10 reps Side Stepping with Resistance at Feet - 1 x daily - 3-4 x weekly - 3 sets - 20 reps Kettlebell Deadlift - 1 x daily - 3-4 x weekly - 4 sets - 5-8 reps Standing Anti-Rotation Press with Anchored Resistance - 3-4 x weekly - 3 sets - 10 reps - 5 seconds hold

## 2020-02-05 ENCOUNTER — Encounter: Payer: BLUE CROSS/BLUE SHIELD | Admitting: Physical Therapy

## 2020-02-12 ENCOUNTER — Ambulatory Visit: Payer: BLUE CROSS/BLUE SHIELD | Admitting: Physical Therapy

## 2020-02-18 ENCOUNTER — Encounter: Payer: Self-pay | Admitting: Physical Therapy

## 2020-02-25 ENCOUNTER — Encounter: Payer: Self-pay | Admitting: Physical Therapy

## 2020-05-09 ENCOUNTER — Telehealth: Payer: Self-pay | Admitting: Podiatry

## 2020-05-09 NOTE — Telephone Encounter (Signed)
Pt left message @244pm  asking if he could possibly order a new pair of orthotics. He got his last year and they are stating to wear down. He stated he called his insurance and they said they would pay for another pair.  Left message for pt that I would verify ok with provider and call pt back and discuss further. pts last office visit was September 2020.

## 2020-07-15 ENCOUNTER — Other Ambulatory Visit: Payer: BLUE CROSS/BLUE SHIELD

## 2020-07-15 DIAGNOSIS — Z20822 Contact with and (suspected) exposure to covid-19: Secondary | ICD-10-CM

## 2020-07-17 ENCOUNTER — Encounter: Payer: Self-pay | Admitting: Nurse Practitioner

## 2020-07-17 ENCOUNTER — Telehealth (INDEPENDENT_AMBULATORY_CARE_PROVIDER_SITE_OTHER): Payer: BLUE CROSS/BLUE SHIELD | Admitting: Nurse Practitioner

## 2020-07-17 VITALS — HR 83 | Temp 96.7°F | Ht 70.0 in

## 2020-07-17 DIAGNOSIS — R509 Fever, unspecified: Secondary | ICD-10-CM

## 2020-07-17 LAB — SARS-COV-2, NAA 2 DAY TAT

## 2020-07-17 LAB — NOVEL CORONAVIRUS, NAA: SARS-CoV-2, NAA: NOT DETECTED

## 2020-07-17 NOTE — Patient Instructions (Signed)
Alternate between tylenol 500mg  and ibuprofen 400mg  every 4-6hrs with food, for fever/malaise/headache. Maintain adequate oral hydration and small frequent meals. Call office with COVID results. If positive COVID results, let me know if you are interested in Monoclonal antibody infusion.  10 Things You Can Do to Manage Your COVID-19 Symptoms at Home If you have possible or confirmed COVID-19: 1. Stay home from work and school. And stay away from other public places. If you must go out, avoid using any kind of public transportation, ridesharing, or taxis. 2. Monitor your symptoms carefully. If your symptoms get worse, call your healthcare provider immediately. 3. Get rest and stay hydrated. 4. If you have a medical appointment, call the healthcare provider ahead of time and tell them that you have or may have COVID-19. 5. For medical emergencies, call 911 and notify the dispatch personnel that you have or may have COVID-19. 6. Cover your cough and sneezes with a tissue or use the inside of your elbow. 7. Wash your hands often with soap and water for at least 20 seconds or clean your hands with an alcohol-based hand sanitizer that contains at least 60% alcohol. 8. As much as possible, stay in a specific room and away from other people in your home. Also, you should use a separate bathroom, if available. If you need to be around other people in or outside of the home, wear a mask. 9. Avoid sharing personal items with other people in your household, like dishes, towels, and bedding. 10. Clean all surfaces that are touched often, like counters, tabletops, and doorknobs. Use household cleaning sprays or wipes according to the label instructions. 04/25/2019 This information is not intended to replace advice given to you by your health care provider. Make sure you discuss any questions you have with your health care provider. Document Revised: 09/27/2019 Document Reviewed:  09/27/2019 Elsevier Patient Education  2020 Elsevier Inc.       Person Under Monitoring Name: Adam Garza  Location: [address] [place] [address] [place]   Infection Prevention Recommendations for Individuals Confirmed to have, or Being Evaluated for, 2019 Novel Coronavirus (COVID-19) Infection Who Receive Care at Home  Individuals who are confirmed to have, or are being evaluated for, COVID-19 should follow the prevention steps below until a healthcare provider or local or state health department says they can return to normal activities.  Stay home except to get medical care You should restrict activities outside your home, except for getting medical care. Do not go to work, school, or public areas, and do not use public transportation or taxis.  Call ahead before visiting your doctor Before your medical appointment, call the healthcare provider and tell them that you have, or are being evaluated for, COVID-19 infection. This will help the healthcare provider's office take steps to keep other people from getting infected. Ask your healthcare provider to call the local or state health department.  Monitor your symptoms Seek prompt medical attention if your illness is worsening (e.g., difficulty breathing). Before going to your medical appointment, call the healthcare provider and tell them that you have, or are being evaluated for, COVID-19 infection. Ask your healthcare provider to call the local or state health department.  Wear a facemask You should wear a facemask that covers your nose and mouth when you are in the same room with other people and when you visit a healthcare provider. People who live with or visit you should also wear a facemask while they are in the  same room with you.  Separate yourself from other people in your home As much as possible, you should stay in a different room from other people in your home. Also, you should use a separate bathroom,  if available.  Avoid sharing household items You should not share dishes, drinking glasses, cups, eating utensils, towels, bedding, or other items with other people in your home. After using these items, you should wash them thoroughly with soap and water.  Cover your coughs and sneezes Cover your mouth and nose with a tissue when you cough or sneeze, or you can cough or sneeze into your sleeve. Throw used tissues in a lined trash can, and immediately wash your hands with soap and water for at least 20 seconds or use an alcohol-based hand rub.  Wash your [employer] your hands often and thoroughly with soap and water for at least 20 seconds. You can use an alcohol-based hand sanitizer if soap and water are not available and if your hands are not visibly dirty. Avoid touching your eyes, nose, and mouth with unwashed hands.   Prevention Steps for Caregivers and Household Members of Individuals Confirmed to have, or Being Evaluated for, COVID-19 Infection Being Cared for in the Home  If you live with, or provide care at home for, a person confirmed to have, or being evaluated for, COVID-19 infection please follow these guidelines to prevent infection:  Follow healthcare provider's instructions Make sure that you understand and can help the patient follow any healthcare provider instructions for all care.  Provide for the patient's basic needs You should help the patient with basic needs in the home and provide support for getting groceries, prescriptions, and other personal needs.  Monitor the patient's symptoms If they are getting sicker, call his or her medical provider and tell them that the patient has, or is being evaluated for, COVID-19 infection. This will help the healthcare provider's office take steps to keep other people from getting infected. Ask the healthcare provider to call the local or state health department.  Limit the number of people who have contact with the  patient  If possible, have only one caregiver for the patient.  Other household members should stay in another home or place of residence. If this is not possible, they should stay  in another room, or be separated from the patient as much as possible. Use a separate bathroom, if available.  Restrict visitors who do not have an essential need to be in the home.  Keep older adults, very young children, and other sick people away from the patient Keep older adults, very young children, and those who have compromised immune systems or chronic health conditions away from the patient. This includes people with chronic heart, lung, or kidney conditions, diabetes, and cancer.  Ensure good ventilation Make sure that shared spaces in the home have good air flow, such as from an air conditioner or an opened window, weather permitting.  Wash your hands often  Wash your hands often and thoroughly with soap and water for at least 20 seconds. You can use an alcohol based hand sanitizer if soap and water are not available and if your hands are not visibly dirty.  Avoid touching your eyes, nose, and mouth with unwashed hands.  Use disposable paper towels to dry your hands. If not available, use dedicated cloth towels and replace them when they become wet.  Wear a facemask and gloves  Wear a disposable facemask at all times in  the room and gloves when you touch or have contact with the patient's blood, body fluids, and/or secretions or excretions, such as sweat, saliva, sputum, nasal mucus, vomit, urine, or feces.  Ensure the mask fits over your nose and mouth tightly, and do not touch it during use.  Throw out disposable facemasks and gloves after using them. Do not reuse.  Wash your hands immediately after removing your facemask and gloves.  If your personal clothing becomes contaminated, carefully remove clothing and launder. Wash your hands after handling contaminated clothing.  Place all used  disposable facemasks, gloves, and other waste in a lined container before disposing them with other household waste.  Remove gloves and wash your hands immediately after handling these items.  Do not share dishes, glasses, or other household items with the patient  Avoid sharing household items. You should not share dishes, drinking glasses, cups, eating utensils, towels, bedding, or other items with a patient who is confirmed to have, or being evaluated for, COVID-19 infection.  After the person uses these items, you should wash them thoroughly with soap and water.  Wash laundry thoroughly  Immediately remove and wash clothes or bedding that have blood, body fluids, and/or secretions or excretions, such as sweat, saliva, sputum, nasal mucus, vomit, urine, or feces, on them.  Wear gloves when handling laundry from the patient.  Read and follow directions on labels of laundry or clothing items and detergent. In general, wash and dry with the warmest temperatures recommended on the label.  Clean all areas the individual has used often  Clean all touchable surfaces, such as counters, tabletops, doorknobs, bathroom fixtures, toilets, phones, keyboards, tablets, and bedside tables, every day. Also, clean any surfaces that may have blood, body fluids, and/or secretions or excretions on them.  Wear gloves when cleaning surfaces the patient has come in contact with.  Use a diluted bleach solution (e.g., dilute bleach with 1 part bleach and 10 parts water) or a household disinfectant with a label that says EPA-registered for coronaviruses. To make a bleach solution at home, add 1 tablespoon of bleach to 1 quart (4 cups) of water. For a larger supply, add  cup of bleach to 1 gallon (16 cups) of water.  Read labels of cleaning products and follow recommendations provided on product labels. Labels contain instructions for safe and effective use of the cleaning product including precautions you should  take when applying the product, such as wearing gloves or eye protection and making sure you have good ventilation during use of the product.  Remove gloves and wash hands immediately after cleaning.  Monitor yourself for signs and symptoms of illness Caregivers and household members are considered close contacts, should monitor their health, and will be asked to limit movement outside of the home to the extent possible. Follow the monitoring steps for close contacts listed on the symptom monitoring form.   ? If you have additional questions, contact your local health department or call the epidemiologist on call at [phone] (available 24/7). ? This guidance is subject to change. For the most up-to-date guidance from [facility], please refer to their website: [email or web address]

## 2020-07-17 NOTE — Progress Notes (Signed)
Virtual Visit via Video Note  I connected with@ on 07/17/20 at 10:15 AM EDT by a video enabled telemedicine application and verified that I am speaking with the correct person using two identifiers.  Location: Patient:Home Provider: Office Participants: patient and provider   I connected with "Patient Name" today by a video enabled telemedicine application and verified that I am speaking with the correct person using two identifiers. Location patient: home Location provider: work Persons participating in the virtual visit: patient, provider  I discussed the limitations of evaluation and management by telemedicine and the availability of in person appointments. I also discussed with the patient that there may be a patient responsible charge related to this service. The patient expressed understanding and agreed to proceed.  CC:Pt c/o chills, body aches, fever (101.4) and dizzines. Pt denies congestion, cough, or loss of taste and smell. Pt tested for COVID on 07/15/20 but has not got results.  History of Present Illness: Completed Moderna COVID vaccine in April Onset of symptoms 07/14/2020, covid test collected 07/15/2020.  Fever  This is a new problem. The current episode started in the past 7 days. The problem occurs intermittently. The problem has been waxing and waning. The maximum temperature noted was 101 to 101.9 F. The temperature was taken using a tympanic thermometer. Associated symptoms include headaches and muscle aches. Pertinent negatives include no abdominal pain, chest pain, congestion, coughing, diarrhea, ear pain, nausea, rash, sleepiness, sore throat, urinary pain, vomiting or wheezing. He has tried NSAIDs for the symptoms. The treatment provided significant relief.  Risk factors: no contaminated food, no contaminated water, no hx of cancer, no immunosuppression, no occupational exposure, no recent sickness, no recent travel and no sick contacts     Observations/Objective: Physical Exam Constitutional:      General: He is not in acute distress. Cardiovascular:     Pulses: Normal pulses.  Pulmonary:     Effort: Pulmonary effort is normal.  Neurological:     Mental Status: He is alert and oriented to person, place, and time.    Assessment and Plan: Boone was seen today for acute visit.  Diagnoses and all orders for this visit:  Fever and chills   Follow Up Instructions: Alternate between tylenol 500mg  and ibuprofen 400mg  every 4-6hrs with food, for fever/malaise/headache. Maintain adequate oral hydration and small frequent meals. Call office with COVID results. If positive COVID results, let me know if you are interested in Monoclonal antibody infusion.   I discussed the assessment and treatment plan with the patient. The patient was provided an opportunity to ask questions and all were answered. The patient agreed with the plan and demonstrated an understanding of the instructions.   The patient was advised to call back or seek an in-person evaluation if the symptoms worsen or if the condition fails to improve as anticipated.   , NP

## 2020-07-21 ENCOUNTER — Ambulatory Visit: Payer: BLUE CROSS/BLUE SHIELD | Admitting: Podiatry

## 2020-07-21 ENCOUNTER — Other Ambulatory Visit: Payer: Self-pay

## 2020-07-21 DIAGNOSIS — M2141 Flat foot [pes planus] (acquired), right foot: Secondary | ICD-10-CM | POA: Diagnosis not present

## 2020-07-21 DIAGNOSIS — M2142 Flat foot [pes planus] (acquired), left foot: Secondary | ICD-10-CM

## 2020-07-21 DIAGNOSIS — M779 Enthesopathy, unspecified: Secondary | ICD-10-CM | POA: Diagnosis not present

## 2020-07-21 NOTE — Progress Notes (Signed)
Subjective:   Patient ID: Adam Garza, male   DOB: 39 y.o.   MRN: [id number]   HPI 39 year old male presents the office today requesting new orthotics.  He states he has flatfeet and the orthotics have been helpful for him.  He denies any significant pain to his feet.  No swelling or redness.  No other concerns.   Review of Systems  All other systems reviewed and are negative.  No past medical history on file.  No past surgical history on file.   Current Outpatient Medications:    lamoTRIgine (LAMICTAL) 150 MG tablet, Take 150 mg by mouth daily., Disp: , Rfl:   Allergies  Allergen Reactions   Penicillins     Had reaction and 39 years old, unknown reaction.        Objective:  Physical Exam  General: AAO x3, NAD  Dermatological: Skin is warm, dry and supple bilateral. There are no open sores, no preulcerative lesions, no rash or signs of infection present.  Vascular: Dorsalis Pedis artery and Posterior Tibial artery pedal pulses are 2/4 bilateral with immedate capillary fill time.  There is no pain with calf compression, swelling, warmth, erythema.   Neruologic: Grossly intact via light touch bilateral.  Musculoskeletal there is a decrease in medial arch upon weightbearing.  There is no significant area of pinpoint tenderness identified today.  Flexor, extensor tendons appear to be intact.  Muscular strength 5/5 in all groups tested bilateral.  Gait: Unassisted, Nonantalgic.       Assessment:   39 year old male with a flatfoot deformity the result of capsulitis, tendinitis.    Plan:  -Treatment options discussed including all alternatives, risks, and complications -He is not having significant pain we must proceed with other peers orthotics that they have been helpful for him.  We ordered him a new pair of orthotics today.  Discussed supportive shoes as well.  Post icing back as needed.  Vivi Barrack DPM

## 2020-08-11 ENCOUNTER — Other Ambulatory Visit: Payer: BLUE CROSS/BLUE SHIELD | Admitting: Orthotics

## 2020-08-11 ENCOUNTER — Other Ambulatory Visit: Payer: Self-pay

## 2020-08-11 IMAGING — MR MR LUMBAR SPINE W/O CM
4 of 5 series · 25 of 48 positions shown · non-contrast
Comparison: Lumbar spine radiographs 11/19/2019

CLINICAL DATA: Lumbar radiculitis. Acute left-sided low back pain
with left-sided sciatica. Lumbar radiculopathy, greater than 6
weeks. Additional history provided by scanning technologist: Acute
left-sided low back pain, left leg weakness/pain/numbness for 1
month, possible weightlifting injury.

EXAM:
MRI LUMBAR SPINE WITHOUT CONTRAST
TECHNIQUE: Multiplanar, multisequence MR imaging of the lumbar spine was
performed. No intravenous contrast was administered.

[Series 2: T2 · sagittal · 4.0mm · 0.81mm/px · 6 of 15 slices shown (1 of 2)]
[im 1/15]
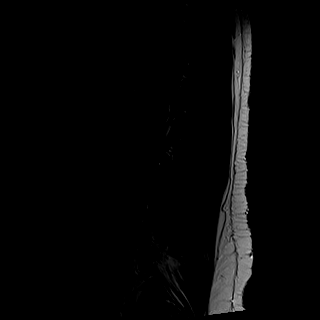
[im 3/15]
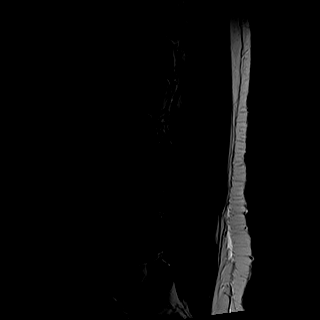
[im 6/15]
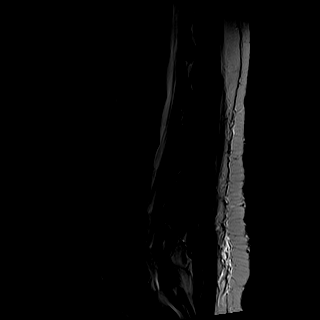
[im 9/15]
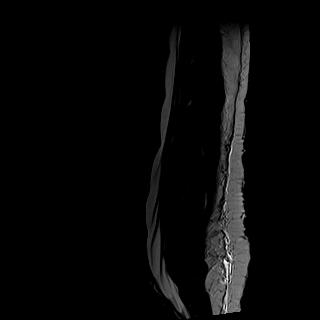
[im 12/15]
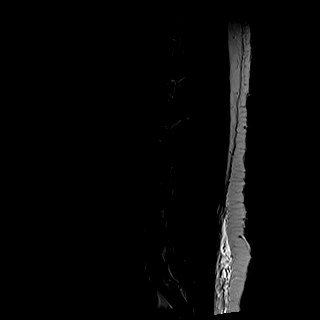
[im 15/15]
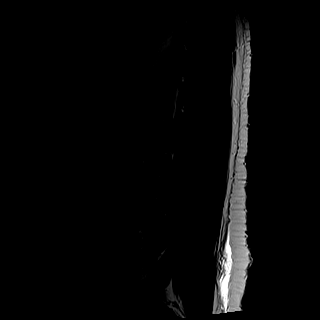

[Series 3: T1 · sagittal · 4.0mm · 0.41mm/px · 5 of 15 slices shown (1 of 2)]
[im 1/15]
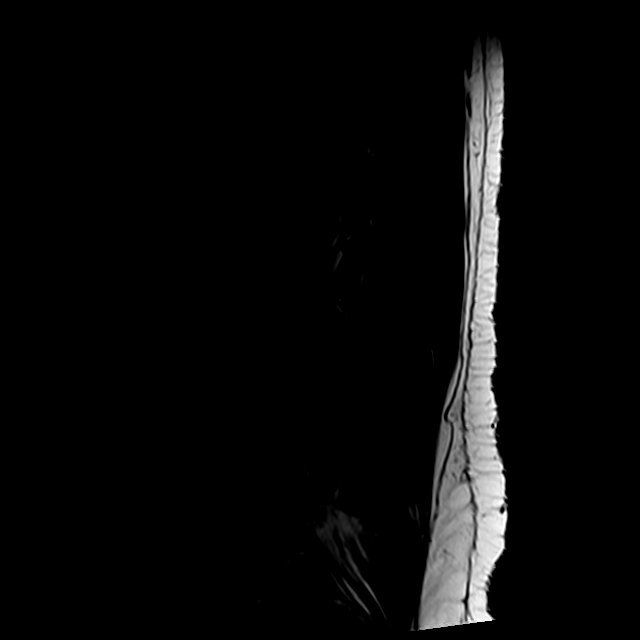
[im 4/15]
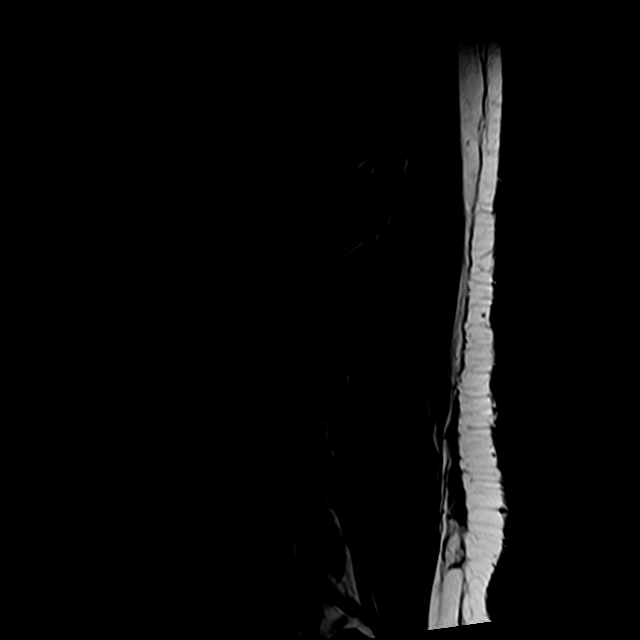
[im 8/15]
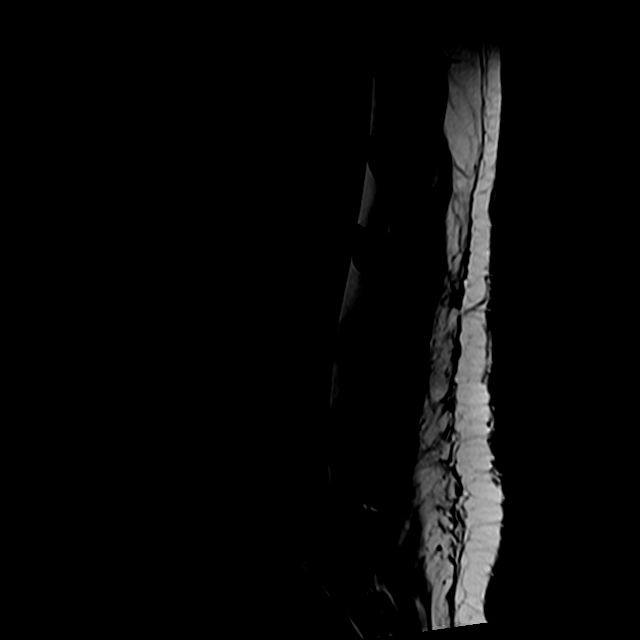
[im 11/15]
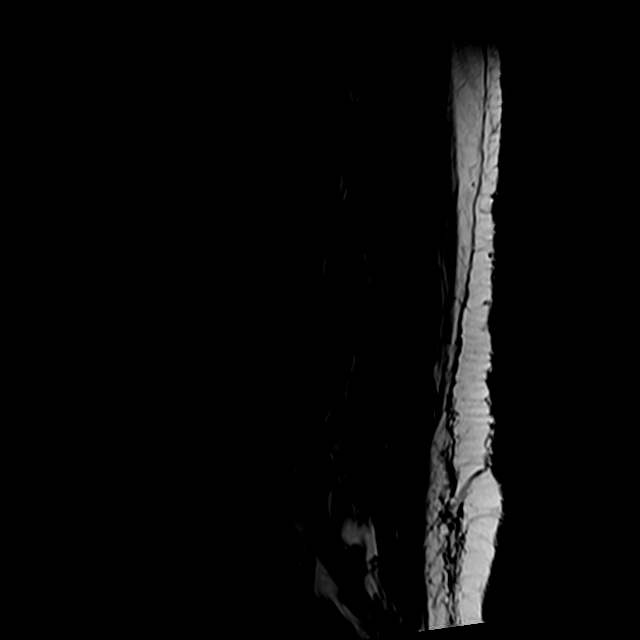
[im 15/15]
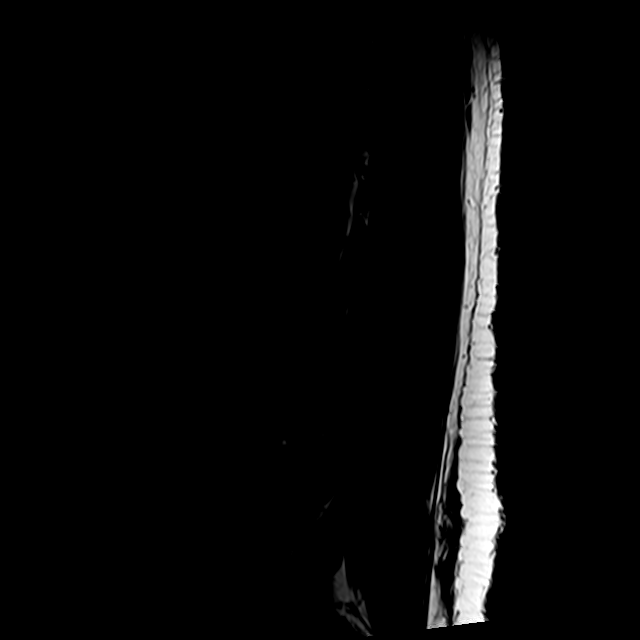

[Series 5: T2 · axial · 4.0mm · 0.78mm/px · z∈[-110,+115]mm · 10 of 44 slices shown (2 of 2)]
[im 3/44]
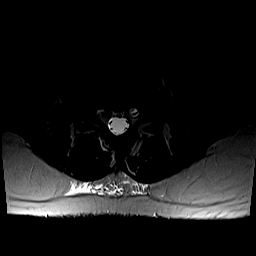
[im 6/44]
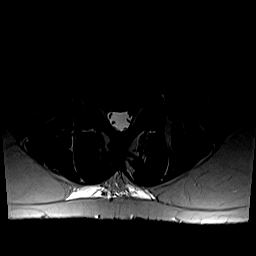
[im 9/44]
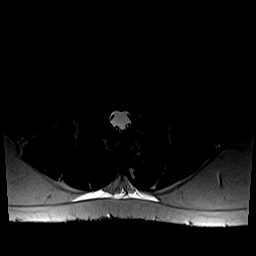
[im 15/44]
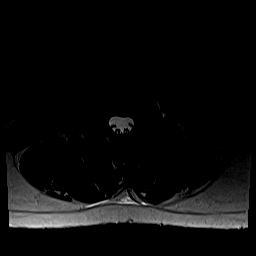
[im 21/44]
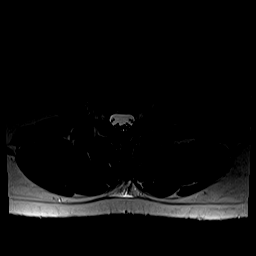
[im 23/44]
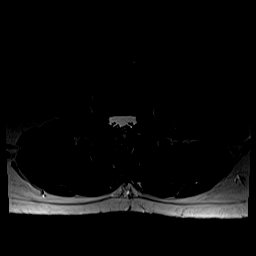
[im 26/44]
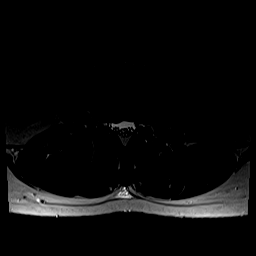
[im 32/44]
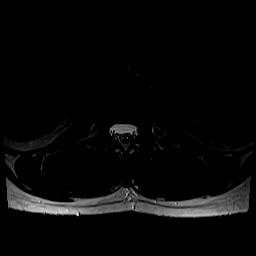
[im 38/44]
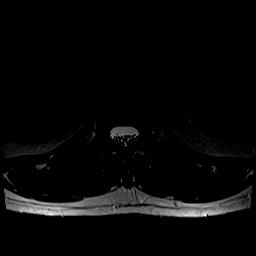
[im 44/44]
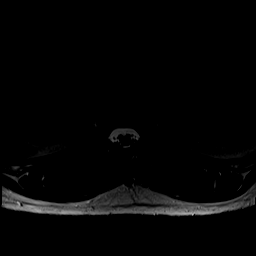

[Series 6: T1 · axial · 4.0mm · 0.39mm/px · z∈[-110,+85]mm · 4 of 44 slices shown (2 of 2)]
[im 3/44]
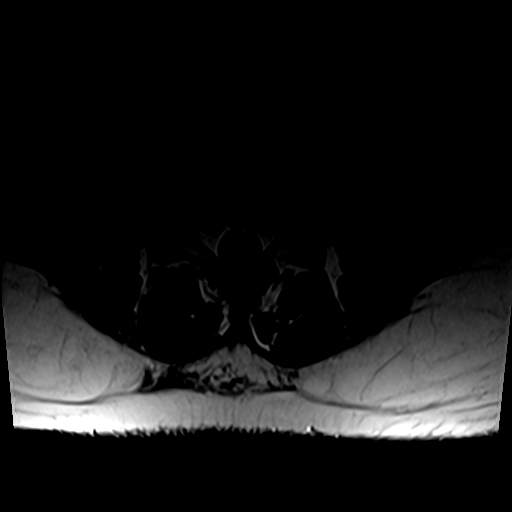
[im 6/44]
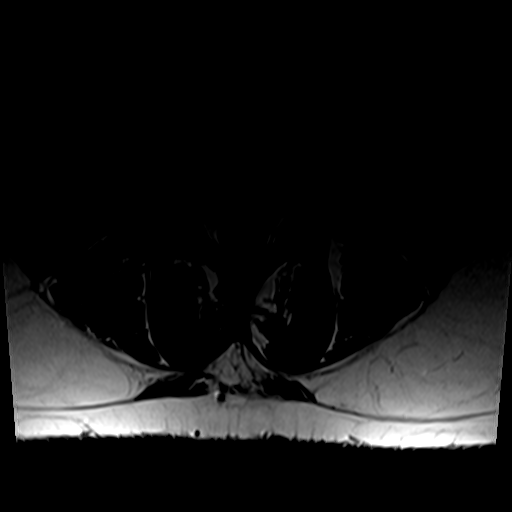
[im 23/44]
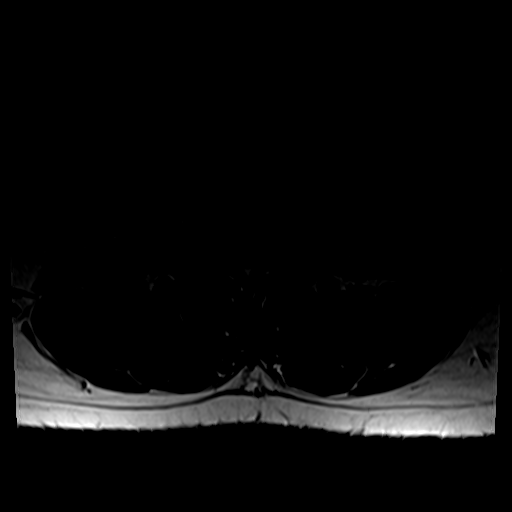
[im 38/44]
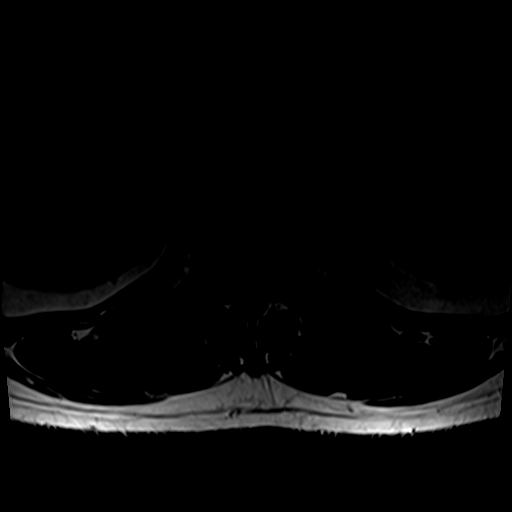

[25 of 48 positions shown; findings below may reference images not displayed]

FINDINGS: Segmentation:  5 lumbar vertebrae.

Alignment: Straightening of the expected lumbar lordosis. Trace
L2-L3 retrolisthesis. 2 mm L3-L4 grade 1 retrolisthesis. 2 mm L5-S1
grade 1 retrolisthesis.

Vertebrae: Vertebral body height is maintained. No marrow edema or
suspicious osseous lesion. L1 vertebral body hemangioma.

Conus medullaris and cauda equina: Conus extends to the L1 inferior
endplate level. No signal abnormality within the visualized distal
spinal cord.

Paraspinal and other soft tissues: No abnormality identified within
included portions of the abdomen/retroperitoneum. Paraspinal soft
tissues within normal limits.

Disc levels:

Mild multilevel disc degeneration greatest at L2-L3, L4-L5 and
L5-S1.

T12-L1: No disc herniation. No significant canal or foraminal
stenosis.

L1-L2: No disc herniation. No significant canal or foraminal
stenosis.

L2-L3: Trace retrolisthesis. Minimal disc bulge with superimposed
small right center disc protrusion. The disc protrusion contributes
to mild right subarticular narrowing and mild relative narrowing of
the central canal without frank nerve root impingement. No
significant foraminal stenosis.

L3-L4: Mild grade 1 retrolisthesis. Minimal disc bulge. Mild facet
arthrosis. No significant spinal canal stenosis. Mild bilateral
neural foraminal narrowing.

L4-L5: Small disc bulge. Superimposed left subarticular/foraminal
disc extrusion with mild cranial and caudal migration. Mild facet
hypertrophy. The disc extrusion contributes to minimal left
subarticular narrowing, possibly contacting the descending left L5
nerve root. Central canal patent. The disc extrusion also
contributes to severe left L4-L5 foraminal stenosis with impingement
of the exiting left L4 nerve root. Mild right neural foraminal
narrowing.

L5-S1: Mild grade 1 retrolisthesis. Small, shallow central disc
protrusion. Mild facet hypertrophy. No significant subarticular or
central canal stenosis, although the disc protrusion may contact the
bilateral descending S1 nerve roots near midline. Mild bilateral
neural foraminal narrowing.
IMPRESSION: Lumbar spondylosis as outlined and most notably as follows.

At L4-L5, small disc bulge. Superimposed left subarticular/foraminal
disc extrusion with mild cranial caudal migration. Mild facet
hypertrophy. The disc extrusion contributes to severe left foraminal
stenosis with impingement of the exiting left L4 nerve root.
Correlate for left L4 radiculopathy. The disc extrusion also
contributes to minimal left subarticular narrowing with possible
contact upon the descending left L5 nerve root. Mild right neural
foraminal narrowing.

At L5-S1, a small central disc protrusion may contact the bilateral
descending S1 nerve roots near midline, although there is not
significant subarticular or central canal stenosis. Mild
multifactorial bilateral neural foraminal narrowing.

At L2-L3, a small right center disc protrusion contributes to mild
right subarticular narrowing and relative narrowing of the central
canal without nerve root impingement.

## 2020-08-21 ENCOUNTER — Telehealth: Payer: Self-pay | Admitting: Podiatry

## 2020-08-21 NOTE — Telephone Encounter (Signed)
lvm for patient to return call to get appointment scheduled to pick up orthotics

## 2020-09-01 ENCOUNTER — Ambulatory Visit: Payer: BLUE CROSS/BLUE SHIELD | Admitting: Orthotics

## 2020-09-01 ENCOUNTER — Other Ambulatory Visit: Payer: Self-pay

## 2020-09-01 DIAGNOSIS — M2141 Flat foot [pes planus] (acquired), right foot: Secondary | ICD-10-CM

## 2020-09-01 DIAGNOSIS — M779 Enthesopathy, unspecified: Secondary | ICD-10-CM

## 2020-09-01 NOTE — Progress Notes (Signed)
Patient came in today to pick up custom made foot orthotics.  The goals were accomplished and the patient reported no dissatisfaction with said orthotics.  Patient was advised of breakin period and how to report any issues. 

## 2020-09-05 IMAGING — XA Imaging study
2 series · 2 of 2 positions shown · non-contrast
Comparison: none

CLINICAL DATA: Lumbosacral spondylosis without myelopathy with
radiculopathy. Left-sided low back pain radiating into the left leg.
Disc protrusion at L4-L5 affecting the left L4 nerve root.

[Series 1: ortho adipose · 1 of 1 slices shown (1 of 2)]
[im 1/1]
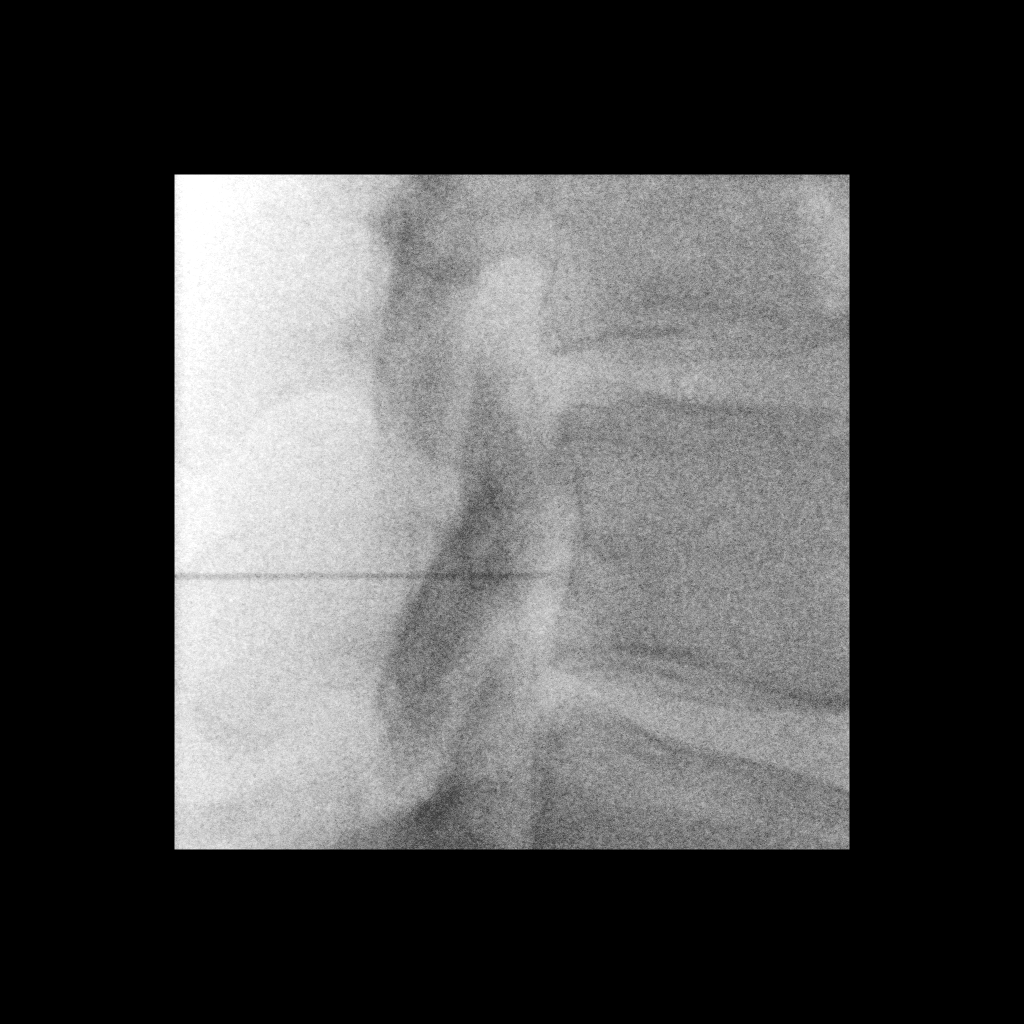

[Series 2: ortho adipose · 1 of 1 slices shown (2 of 2)]
[im 1/1]
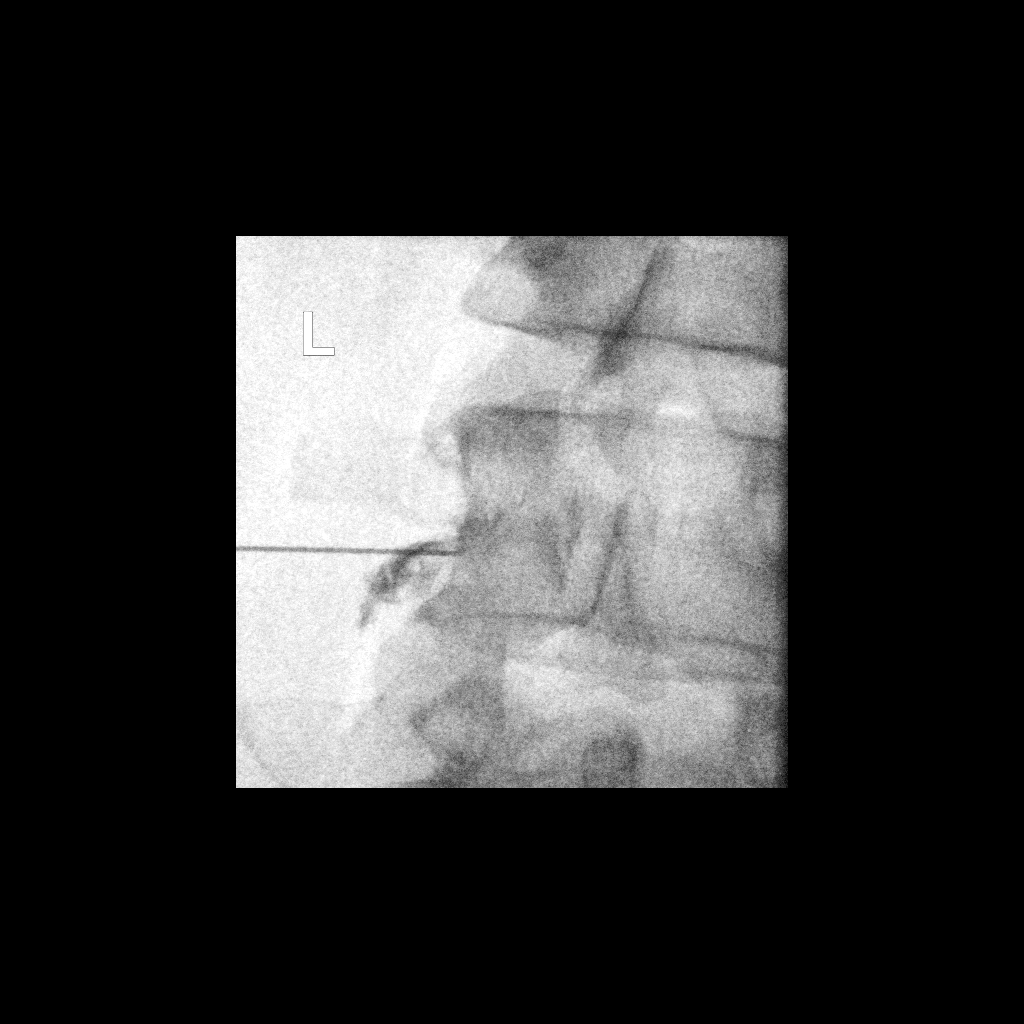

[2 of 2 positions shown; findings below may reference images not displayed]

EXAM:
DG INJECT/FOYAZ INC NEEDLE/PAULUS N/WONG EPI/BEENA/SAC W/IMG

FLUOROSCOPY TIME:  Radiation Exposure Index (as provided by the
fluoroscopic device): 6.2 mGy

Fluoroscopy Time:  30 seconds

Number of Acquired Images:  0

PROCEDURE:
The procedure, risks, benefits, and alternatives were explained to
the patient. Questions regarding the procedure were encouraged and
answered. The patient understands and consents to the procedure.

LEFT L4 NERVE ROOT BLOCK AND TRANSFORAMINAL EPIDURAL: A posterior
oblique approach was taken to the intervertebral foramen on the left
at L4-L5 using a 5 inch 22 gauge spinal needle. Injection of
Isovue-M 200 outlined the left L4 nerve root and showed good
epidural spread. No vascular opacification is seen. 120 mg of
Depo-Medrol mixed with 2 mL of 1% lidocaine were instilled. The
procedure was well-tolerated, and the patient was discharged thirty
minutes following the injection in good condition.

COMPLICATIONS:
None immediate.
IMPRESSION: Technically successful injection consisting of a left L4 nerve root
block and transforaminal epidural.

## 2021-05-04 ENCOUNTER — Telehealth: Payer: BLUE CROSS/BLUE SHIELD | Admitting: Podiatry

## 2021-05-04 DIAGNOSIS — M2142 Flat foot [pes planus] (acquired), left foot: Secondary | ICD-10-CM

## 2021-05-04 DIAGNOSIS — M2141 Flat foot [pes planus] (acquired), right foot: Secondary | ICD-10-CM | POA: Diagnosis not present

## 2021-05-04 DIAGNOSIS — M779 Enthesopathy, unspecified: Secondary | ICD-10-CM

## 2021-05-04 NOTE — Telephone Encounter (Signed)
Pt called asking for another pair of orthotics to be ordered just like last yr.  I verified last appt and it has not been a yr and Dr Ardelle Anton said it was ok to go ahead and order another pr. I told pt I would bill insurance and call when they come in.

## 2021-05-15 ENCOUNTER — Telehealth: Payer: Self-pay | Admitting: Podiatry

## 2021-05-15 NOTE — Telephone Encounter (Signed)
2nd pr orthotics in.. lvm for pt ok to pick up.. 

## 2021-07-13 ENCOUNTER — Other Ambulatory Visit: Payer: Self-pay

## 2021-07-13 ENCOUNTER — Ambulatory Visit (INDEPENDENT_AMBULATORY_CARE_PROVIDER_SITE_OTHER): Payer: BLUE CROSS/BLUE SHIELD | Admitting: Family Medicine

## 2021-07-13 ENCOUNTER — Encounter: Payer: Self-pay | Admitting: Family Medicine

## 2021-07-13 VITALS — BP 102/76 | HR 64 | Temp 97.4°F | Ht 69.0 in | Wt 198.6 lb

## 2021-07-13 DIAGNOSIS — Z23 Encounter for immunization: Secondary | ICD-10-CM | POA: Diagnosis not present

## 2021-07-13 DIAGNOSIS — Z0000 Encounter for general adult medical examination without abnormal findings: Secondary | ICD-10-CM

## 2021-07-13 DIAGNOSIS — F40243 Fear of flying: Secondary | ICD-10-CM | POA: Diagnosis not present

## 2021-07-13 LAB — COMPREHENSIVE METABOLIC PANEL
ALT: 16 U/L (ref 0–53)
AST: 18 U/L (ref 0–37)
Albumin: 4.6 g/dL (ref 3.5–5.2)
Alkaline Phosphatase: 75 U/L (ref 39–117)
BUN: 17 mg/dL (ref 6–23)
CO2: 28 mEq/L (ref 19–32)
Calcium: 9.5 mg/dL (ref 8.4–10.5)
Chloride: 101 mEq/L (ref 96–112)
Creatinine, Ser: 1.01 mg/dL (ref 0.40–1.50)
GFR: 93.07 mL/min (ref >60.00)
Glucose, Bld: 86 mg/dL (ref 70–99)
Potassium: 4.6 mEq/L (ref 3.5–5.1)
Sodium: 136 mEq/L (ref 135–145)
Total Bilirubin: 0.6 mg/dL (ref 0.2–1.2)
Total Protein: 7.6 g/dL (ref 6.0–8.3)

## 2021-07-13 LAB — URINALYSIS, ROUTINE W REFLEX MICROSCOPIC
Bilirubin Urine: NEGATIVE
Hgb urine dipstick: NEGATIVE
Ketones, ur: NEGATIVE
Leukocytes,Ua: NEGATIVE
Nitrite: NEGATIVE
RBC / HPF: NONE SEEN (ref 0–?)
Specific Gravity, Urine: 1.02 (ref 1.000–1.030)
Total Protein, Urine: NEGATIVE
Urine Glucose: NEGATIVE
Urobilinogen, UA: 0.2 (ref 0.0–1.0)
pH: 6 (ref 5.0–8.0)

## 2021-07-13 LAB — CBC
HCT: 44.3 % (ref 39.0–52.0)
Hemoglobin: 15.3 g/dL (ref 13.0–17.0)
MCHC: 34.4 g/dL (ref 30.0–36.0)
MCV: 87.2 fl (ref 78.0–100.0)
Platelets: 260 10*3/uL (ref 150.0–400.0)
RBC: 5.08 Mil/uL (ref 4.22–5.81)
RDW: 13.9 % (ref 11.5–15.5)
WBC: 6.7 10*3/uL (ref 4.0–10.5)

## 2021-07-13 LAB — LIPID PANEL
Cholesterol: 162 mg/dL (ref 0–200)
HDL: 31.7 mg/dL — ABNORMAL LOW (ref >39.00)
LDL Cholesterol: 96 mg/dL (ref 0–99)
NonHDL: 130.17
Total CHOL/HDL Ratio: 5
Triglycerides: 169 mg/dL — ABNORMAL HIGH (ref 0.0–149.0)
VLDL: 33.8 mg/dL (ref 0.0–40.0)

## 2021-07-13 MED ORDER — HYDROXYZINE PAMOATE 25 MG PO CAPS: 25.0000 mg | ORAL_CAPSULE | Freq: Three times a day (TID) | ORAL | 0 refills | Status: DC | PRN
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
## 2021-09-04 ENCOUNTER — Encounter: Payer: Self-pay | Admitting: Family Medicine

## 2022-03-15 ENCOUNTER — Other Ambulatory Visit: Payer: Self-pay | Admitting: Family Medicine

## 2022-03-15 ENCOUNTER — Encounter: Payer: Self-pay | Admitting: Family Medicine

## 2022-03-15 DIAGNOSIS — F40243 Fear of flying: Secondary | ICD-10-CM

## 2022-03-16 MED ORDER — HYDROXYZINE PAMOATE 25 MG PO CAPS: 25.0000 mg | ORAL_CAPSULE | Freq: Three times a day (TID) | ORAL | 0 refills | Status: DC | PRN

## 2022-03-16 NOTE — Telephone Encounter (Signed)
Refill request for:  Hydroxyzine 25 mg LR 07/13/21, #15, 0 rf LOV 07/13/21 FOV 07/03/22  Last year I requested medication for sever anxiety while flying. This was extremely helpful. I have another long flight coming up and I would like to be able to have this again.   Please review and advise.  Thanks. Dm/cma

## 2022-03-17 ENCOUNTER — Ambulatory Visit (INDEPENDENT_AMBULATORY_CARE_PROVIDER_SITE_OTHER): Payer: BLUE CROSS/BLUE SHIELD | Admitting: Family Medicine

## 2022-03-17 ENCOUNTER — Encounter: Payer: Self-pay | Admitting: Family Medicine

## 2022-03-17 VITALS — BP 104/70 | HR 64 | Temp 97.1°F | Ht 69.0 in | Wt 188.2 lb

## 2022-03-17 DIAGNOSIS — Z23 Encounter for immunization: Secondary | ICD-10-CM | POA: Diagnosis not present

## 2022-03-17 DIAGNOSIS — K4090 Unilateral inguinal hernia, without obstruction or gangrene, not specified as recurrent: Secondary | ICD-10-CM

## 2022-03-17 NOTE — Progress Notes (Signed)
Established Patient Office Visit  Subjective   Patient ID: Adam Garza, male    DOB: February 10, 1981  Age: 41 y.o. MRN: [id number]  Chief Complaint  Patient presents with   Inguinal Hernia    Discuss hernia sometimes a little pain when active. Patient would also like to start Gardasil vaccines.     HPI here for evaluation of what he believes to be hernia.  He does feel a bulge in the inguinal area.  He feels numbness and discomfort in this area at times especially when he is exerting himself.  He would like to start the HPV series.    Review of Systems  Constitutional: Negative.   HENT: Negative.    Eyes:  Negative for blurred vision, discharge and redness.  Respiratory: Negative.    Cardiovascular: Negative.   Gastrointestinal:  Negative for abdominal pain, nausea and vomiting.  Genitourinary: Negative.  Negative for frequency and urgency.  Musculoskeletal: Negative.  Negative for myalgias.  Skin:  Negative for rash.  Neurological:  Negative for tingling, loss of consciousness and weakness.  Endo/Heme/Allergies:  Negative for polydipsia.     Objective:     BP 104/70 (BP Location: Right Arm, Patient Position: Sitting, Cuff Size: Large)   Pulse 64   Temp (!) 97.1 F (36.2 C) (Temporal)   Ht 5\' 9"  (1.753 m)   Wt 188 lb 3.2 oz (85.4 kg)   SpO2 97%   BMI 27.79 kg/m    Physical Exam Constitutional:      General: He is not in acute distress.    Appearance: Normal appearance. He is not ill-appearing, toxic-appearing or diaphoretic.  HENT:     Head: Normocephalic and atraumatic.     Right Ear: External ear normal.     Left Ear: External ear normal.  Eyes:     General: No scleral icterus.       Right eye: No discharge.        Left eye: No discharge.     Extraocular Movements: Extraocular movements intact.     Conjunctiva/sclera: Conjunctivae normal.  Pulmonary:     Effort: Pulmonary effort is normal. No respiratory distress.  Abdominal:     Tenderness: There is no  abdominal tenderness. There is no guarding.     Hernia: A hernia is present. Hernia is present in the right inguinal area. There is no hernia in the left inguinal area.  Genitourinary:    Penis: Circumcised. No hypospadias, erythema, tenderness, discharge, swelling or lesions.      Testes:        Right: Mass, tenderness or swelling not present. Right testis is descended.        Left: Mass, tenderness or swelling not present. Left testis is descended.     Epididymis:     Right: Not inflamed or enlarged.     Left: Not inflamed or enlarged.     Comments: Soft tissue mass noted in right inguinal area. There was NO change with valsalva.  Musculoskeletal:     Cervical back: No rigidity or tenderness.  Lymphadenopathy:     Lower Body: No right inguinal adenopathy. No left inguinal adenopathy.  Skin:    General: Skin is warm and dry.  Neurological:     Mental Status: He is alert and oriented to person, place, and time.  Psychiatric:        Mood and Affect: Mood normal.        Behavior: Behavior normal.     No results found for any  visits on 03/17/22.    The 10-year ASCVD risk score (Arnett DK, et al., 2019) is: 1.1%    Assessment & Plan:   Problem List Items Addressed This Visit       Other   Need for HPV vaccination - Primary   Relevant Orders   HPV 9-valent vaccine,Recombinat (Completed)   Unilateral inguinal hernia without obstruction or gangrene   Relevant Orders   Ambulatory referral to General Surgery    Return return at 2 and 6 months for 2nd and 3rd doses..  Information given on inguinal hernias and HPV vaccines.  He realizes his insurance may not cover it.  He is welcome to use his healthcare savings account to pay for it.  Mliss Sax, MD

## 2022-05-19 ENCOUNTER — Ambulatory Visit (INDEPENDENT_AMBULATORY_CARE_PROVIDER_SITE_OTHER): Payer: BLUE CROSS/BLUE SHIELD

## 2022-05-19 DIAGNOSIS — Z23 Encounter for immunization: Secondary | ICD-10-CM | POA: Diagnosis not present

## 2022-05-19 NOTE — Progress Notes (Signed)
After obtaining consent, and per orders of Dr. Doreene Burke, injection of HPV#2 given by [facility]. Patient instructed to remain in clinic for 20 minutes afterwards, and to report any adverse reaction to me immediately.

## 2022-05-24 ENCOUNTER — Telehealth: Payer: Self-pay | Admitting: Podiatry

## 2022-05-24 NOTE — Telephone Encounter (Signed)
Pt Orders Orthotics yearly and stated hes been calling and lvm and hasnt heard back from anyone as of yet.  Please advise

## 2022-06-04 NOTE — Telephone Encounter (Signed)
Patient called and stated that he is interested in new orthotics. He stated that he was told that he does not need to see the provider to get another pair of orthotics to be made.   Please advise before I schedule him on the casting schedule.

## 2022-06-08 NOTE — Telephone Encounter (Signed)
Per policy the patient needs an OV each year unless approved and documented by the provider.   Please advise.

## 2022-06-30 ENCOUNTER — Telehealth: Payer: Self-pay | Admitting: Podiatry

## 2022-06-30 NOTE — Telephone Encounter (Signed)
Patient needs to order another pair of orthotics , nothing has changed with his feet and he needs to get these ordered and back within the next 3 weeks.  He was told he did not need appt to reorder orthotics?

## 2022-07-01 NOTE — Telephone Encounter (Signed)
Left message on voicemail letting the patient know that we are gonna check with his insurance about ordering another set without him coming into the office. We will let him know if he needs appt

## 2022-07-13 ENCOUNTER — Encounter: Payer: BLUE CROSS/BLUE SHIELD | Admitting: Family Medicine

## 2022-07-21 ENCOUNTER — Encounter: Payer: Self-pay | Admitting: Family Medicine

## 2022-07-21 ENCOUNTER — Ambulatory Visit (INDEPENDENT_AMBULATORY_CARE_PROVIDER_SITE_OTHER): Payer: BLUE CROSS/BLUE SHIELD | Admitting: Family Medicine

## 2022-07-21 VITALS — BP 118/78 | HR 53 | Temp 97.2°F | Ht 69.0 in | Wt 192.2 lb

## 2022-07-21 DIAGNOSIS — F40243 Fear of flying: Secondary | ICD-10-CM

## 2022-07-21 DIAGNOSIS — Z131 Encounter for screening for diabetes mellitus: Secondary | ICD-10-CM | POA: Insufficient documentation

## 2022-07-21 DIAGNOSIS — Z0000 Encounter for general adult medical examination without abnormal findings: Secondary | ICD-10-CM | POA: Diagnosis not present

## 2022-07-21 DIAGNOSIS — Z23 Encounter for immunization: Secondary | ICD-10-CM

## 2022-07-21 LAB — CBC
HCT: 42.7 % (ref 39.0–52.0)
Hemoglobin: 15 g/dL (ref 13.0–17.0)
MCHC: 35 g/dL (ref 30.0–36.0)
MCV: 86.5 fl (ref 78.0–100.0)
Platelets: 274 10*3/uL (ref 150.0–400.0)
RBC: 4.94 Mil/uL (ref 4.22–5.81)
RDW: 13.6 % (ref 11.5–15.5)
WBC: 7.1 10*3/uL (ref 4.0–10.5)

## 2022-07-21 LAB — COMPREHENSIVE METABOLIC PANEL
ALT: 18 U/L (ref 0–53)
AST: 22 U/L (ref 0–37)
Albumin: 4.3 g/dL (ref 3.5–5.2)
Alkaline Phosphatase: 82 U/L (ref 39–117)
BUN: 19 mg/dL (ref 6–23)
CO2: 27 mEq/L (ref 19–32)
Calcium: 9.1 mg/dL (ref 8.4–10.5)
Chloride: 102 mEq/L (ref 96–112)
Creatinine, Ser: 0.92 mg/dL (ref 0.40–1.50)
GFR: 103.36 mL/min (ref >60.00)
Glucose, Bld: 88 mg/dL (ref 70–99)
Potassium: 4.5 mEq/L (ref 3.5–5.1)
Sodium: 135 mEq/L (ref 135–145)
Total Bilirubin: 0.5 mg/dL (ref 0.2–1.2)
Total Protein: 7.6 g/dL (ref 6.0–8.3)

## 2022-07-21 LAB — LIPID PANEL
Cholesterol: 171 mg/dL (ref 0–200)
HDL: 33.4 mg/dL — ABNORMAL LOW (ref >39.00)
NonHDL: 137.7
Total CHOL/HDL Ratio: 5
Triglycerides: 289 mg/dL — ABNORMAL HIGH (ref 0.0–149.0)
VLDL: 57.8 mg/dL — ABNORMAL HIGH (ref 0.0–40.0)

## 2022-07-21 LAB — URINALYSIS, ROUTINE W REFLEX MICROSCOPIC
Bilirubin Urine: NEGATIVE
Hgb urine dipstick: NEGATIVE
Ketones, ur: NEGATIVE
Leukocytes,Ua: NEGATIVE
Nitrite: NEGATIVE
Specific Gravity, Urine: <=1.005 — AB (ref 1.000–1.030)
Total Protein, Urine: NEGATIVE
Urine Glucose: NEGATIVE
Urobilinogen, UA: 0.2 (ref 0.0–1.0)
pH: 5.5 (ref 5.0–8.0)

## 2022-07-21 LAB — LDL CHOLESTEROL, DIRECT: Direct LDL: 104 mg/dL

## 2022-07-21 MED ORDER — BUSPIRONE HCL 7.5 MG PO TABS: 7.5000 mg | ORAL_TABLET | Freq: Two times a day (BID) | ORAL | 1 refills | Status: DC

## 2022-07-21 MED ORDER — BUSPIRONE HCL 7.5 MG PO TABS: 7.5000 mg | ORAL_TABLET | Freq: Two times a day (BID) | ORAL | 0 refills | Status: DC

## 2022-07-21 NOTE — Progress Notes (Signed)
Established Patient Office Visit  Subjective   Patient ID: Adam Garza, male    DOB: 10/12/1981  Age: 41 y.o. MRN: [id number]  Chief Complaint  Patient presents with   Annual Exam    CPE discuss Hydroxyzine seems to be wearing off to soon.     HPI for yearly physical.  Has been doing well.  Back is improved.  Continues with HPV series.  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  He is traveling frequently for the holiday season.  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  Hydroxyzine does not seem to be lasting as long.  He would like to try different medication.    Review of Systems  Constitutional: Negative.   HENT: Negative.    Eyes:  Negative for blurred vision, discharge and redness.  Respiratory: Negative.    Cardiovascular: Negative.   Gastrointestinal:  Negative for abdominal pain.  Genitourinary: Negative.   Musculoskeletal: Negative.  Negative for myalgias.  Skin:  Negative for rash.  Neurological:  Negative for tingling, loss of consciousness and weakness.  Endo/Heme/Allergies:  Negative for polydipsia.      07/21/2022    8:49 AM 07/21/2022    8:36 AM 03/17/2022    8:58 AM  Depression screen PHQ 2/9  Decreased Interest 0 0 0  Down, Depressed, Hopeless 0 0 0  PHQ - 2 Score 0 0 0  Altered sleeping 0    Tired, decreased energy 0    Change in appetite 0    Feeling bad or failure about yourself  0    Trouble concentrating 0    Moving slowly or fidgety/restless 0    Suicidal thoughts 0    PHQ-9 Score 0    Difficult doing work/chores Not difficult at all         Objective:     BP 118/78 (BP Location: Left Arm, Patient Position: Sitting, Cuff Size: Normal)   Pulse (!) 53   Temp (!) 97.2 F (36.2 C) (Temporal)   Ht 5\' 9"  (1.753 m)   Wt 192 lb 3.2 oz (87.2 kg)   SpO2 98%   BMI 28.38 kg/m    Physical Exam Constitutional:      General: He is not in acute distress.     Appearance: Normal appearance. He is not ill-appearing, toxic-appearing or diaphoretic.  HENT:     Head: Normocephalic and atraumatic.     Right Ear: External ear normal.     Left Ear: External ear normal.     Mouth/Throat:     Mouth: Mucous membranes are moist.     Pharynx: Oropharynx is clear. No oropharyngeal exudate or posterior oropharyngeal erythema.  Eyes:     General: No scleral icterus.       Right eye: No discharge.        Left eye: No discharge.     Extraocular Movements: Extraocular movements intact.     Conjunctiva/sclera: Conjunctivae normal.     Pupils: Pupils are equal, round, and reactive to light.  Cardiovascular:     Rate and Rhythm: Normal rate and regular rhythm.  Pulmonary:     Effort: Pulmonary effort is normal. No respiratory distress.     Breath sounds: Normal breath sounds.  Abdominal:     General: Bowel sounds are normal.     Tenderness: There is no abdominal tenderness. There is no guarding.  Musculoskeletal:     Cervical back:  No rigidity or tenderness.  Skin:    General: Skin is warm and dry.  Neurological:     Mental Status: He is alert and oriented to person, place, and time.  Psychiatric:        Mood and Affect: Mood normal.        Behavior: Behavior normal.      No results found for any visits on 07/21/22.    The 10-year ASCVD risk score (Arnett DK, et al., 2019) is: 1.4%    Assessment & Plan:   Problem List Items Addressed This Visit       Other   Healthcare maintenance - Primary   Relevant Orders   CBC   Lipid panel   Urinalysis, Routine w reflex microscopic   Comprehensive metabolic panel   Fear of flying   Relevant Medications   busPIRone (BUSPAR) 7.5 MG tablet   Other Visit Diagnoses     Need for influenza vaccination       Relevant Orders   Flu Vaccine QUAD 6+ mos PF IM (Fluarix Quad PF) (Completed)       Return in about 1 year (around 07/22/2023), or if symptoms worsen or fail to improve.  Advised him to go  ahead and take the buspirone on travel days twice daily.  He will let me know.  Libby Maw, MD

## 2022-07-30 ENCOUNTER — Encounter: Payer: Self-pay | Admitting: Family Medicine

## 2022-09-14 ENCOUNTER — Ambulatory Visit (INDEPENDENT_AMBULATORY_CARE_PROVIDER_SITE_OTHER): Payer: BLUE CROSS/BLUE SHIELD

## 2022-09-14 ENCOUNTER — Ambulatory Visit: Payer: BLUE CROSS/BLUE SHIELD

## 2022-09-14 DIAGNOSIS — Z23 Encounter for immunization: Secondary | ICD-10-CM

## 2022-09-14 NOTE — Progress Notes (Signed)
After obtaining consent, and per orders of Dr. Park Liter, injection of HPV given by Philipp Deputy. Patient tolerate injection well and instructed to remain in clinic for 20 minutes afterwards, and to report any adverse reaction to me immediately.

## 2022-11-26 ENCOUNTER — Other Ambulatory Visit: Payer: Self-pay

## 2022-11-26 ENCOUNTER — Encounter: Payer: Self-pay | Admitting: Family Medicine

## 2022-11-26 DIAGNOSIS — F40243 Fear of flying: Secondary | ICD-10-CM

## 2022-11-26 DIAGNOSIS — E782 Mixed hyperlipidemia: Secondary | ICD-10-CM

## 2022-11-26 MED ORDER — HYDROXYZINE PAMOATE 25 MG PO CAPS: 25.0000 mg | ORAL_CAPSULE | Freq: Three times a day (TID) | ORAL | 0 refills | Status: DC | PRN

## 2023-01-26 ENCOUNTER — Other Ambulatory Visit: Payer: Self-pay

## 2023-01-26 ENCOUNTER — Other Ambulatory Visit (INDEPENDENT_AMBULATORY_CARE_PROVIDER_SITE_OTHER): Payer: BLUE CROSS/BLUE SHIELD

## 2023-01-26 DIAGNOSIS — E782 Mixed hyperlipidemia: Secondary | ICD-10-CM

## 2023-01-26 LAB — LIPID PANEL
Cholesterol: 160 mg/dL (ref 0–200)
HDL: 26.6 mg/dL — ABNORMAL LOW (ref >39.00)
NonHDL: 133.17
Total CHOL/HDL Ratio: 6
Triglycerides: 219 mg/dL — ABNORMAL HIGH (ref 0.0–149.0)
VLDL: 43.8 mg/dL — ABNORMAL HIGH (ref 0.0–40.0)

## 2023-01-26 LAB — LDL CHOLESTEROL, DIRECT: Direct LDL: 102 mg/dL

## 2023-02-08 ENCOUNTER — Telehealth: Payer: BLUE CROSS/BLUE SHIELD | Admitting: Family Medicine

## 2023-02-08 DIAGNOSIS — R197 Diarrhea, unspecified: Secondary | ICD-10-CM | POA: Diagnosis not present

## 2023-02-08 NOTE — Patient Instructions (Signed)
Adam Garza, thank you for joining Freddy Finner, NP for today's virtual visit.  While this provider is not your primary care provider (PCP), if your PCP is located in our provider database this encounter information will be shared with them immediately following your visit.   A [facility] MyChart account gives you access to today's visit and all your visits, tests, and labs performed at [facility] " click here if you don't have a [facility] MyChart account or go to [email or web address]  Consent: (Patient) Adam Garza provided verbal consent for this virtual visit at the beginning of the encounter.  Current Medications:  Current Outpatient Medications:    busPIRone (BUSPAR) 7.5 MG tablet, Take 1 tablet (7.5 mg total) by mouth 2 (two) times daily., Disp: 60 tablet, Rfl: 1   hydrOXYzine (VISTARIL) 25 MG capsule, Take 1 capsule (25 mg total) by mouth every 8 (eight) hours as needed., Disp: 15 capsule, Rfl: 0   Medications ordered in this encounter:  No orders of the defined types were placed in this encounter.    *If you need refills on other medications prior to your next appointment, please contact your pharmacy*  Follow-Up: Call back or seek an in-person evaluation if the symptoms worsen or if the condition fails to improve as anticipated.  [facility] [phone]  Other Instructions  Diarrhea, Adult Diarrhea is when you pass loose and sometimes watery poop (stool) often. Diarrhea can make you feel weak and cause you to lose water in your body (get dehydrated). Losing water in your body can cause you to: Feel tired and thirsty. Have a dry mouth. Go pee (urinate) less often. Diarrhea often lasts 2-3 days. It can last longer if it is a sign of something more serious. Be sure to treat your diarrhea as told by your doctor. Follow these instructions at home: Eating and drinking     Follow these instructions as told by your doctor: Take an ORS  (oral rehydration solution). This is a drink that helps you replace fluids and minerals your body lost. It is sold at pharmacies and stores. Drink enough fluid to keep your pee (urine) pale yellow. Drink fluids such as: Water. You can also get fluids by sucking on ice chips. Diluted fruit juice. Low-calorie sports drinks. Milk. Avoid drinking fluids that have a lot of sugar or caffeine in them. These include soda, energy drinks, and regular sports drinks. Avoid alcohol. Eat bland, easy-to-digest foods in small amounts as you are able. These foods include: Bananas. Applesauce. Rice. Low-fat (lean) meats. Toast. Crackers. Avoid spicy or fatty foods.  Medicines Take over-the-counter and prescription medicines only as told by your doctor. If you were prescribed antibiotics, take them as told by your doctor. Do not stop taking them even if you start to feel better. General instructions  Wash your hands often using soap and water for 20 seconds. If soap and water are not available, use hand sanitizer. Others in your home should wash their hands as well. Wash your hands: After using the toilet or changing a diaper. Before preparing, cooking, or serving food. While caring for a sick person. While visiting someone in a hospital. Rest at home while you get better. Take a warm bath to help with any burning or pain from having diarrhea. Watch your condition for any changes. Contact a doctor if: You have a fever. Your diarrhea gets worse. You have new symptoms. You vomit every time you eat or drink. You feel light-headed,  dizzy, or you have a headache. You have muscle cramps. You have signs of losing too much water in your body, such as: Dark pee, very little pee, or no pee. Cracked lips. Dry mouth. Sunken eyes. Sleepiness. Weakness. You have bloody or black poop or poop that looks like tar. You have very bad pain, cramping, or bloating in your belly (abdomen). Your skin feels cold  and clammy. You feel confused. Get help right away if: You have chest pain. Your heart is beating very quickly. You have trouble breathing or you are breathing very quickly. You feel very weak or you faint. These symptoms may be an emergency. Get help right away. Call 911. Do not wait to see if the symptoms will go away. Do not drive yourself to the hospital. This information is not intended to replace advice given to you by your health care provider. Make sure you discuss any questions you have with your health care provider. Document Revised: 03/30/2022 Document Reviewed: 03/30/2022 Elsevier Patient Education  2023 Elsevier Inc.    If you have been instructed to have an in-person evaluation today at a local Urgent Care facility, please use the link below. It will take you to a list of all of our available [facility], including address, phone number and hours of operation. Please do not delay care.  [facility]  If you or a family member do not have a primary care provider, use the link below to schedule a visit and establish care. When you choose a [facility] primary care physician or advanced practice provider, you gain a long-term partner in health. Find a Primary Care Provider  Learn more about [facility] in-office and virtual care options: [facility] - Get Care Now

## 2023-02-08 NOTE — Progress Notes (Signed)
Virtual Visit Consent   Adam Garza, you are scheduled for a virtual visit with a Agency provider today. Just as with appointments in the office, your consent must be obtained to participate. Your consent will be active for this visit and any virtual visit you may have with one of our providers in the next 365 days. If you have a MyChart account, a copy of this consent can be sent to you electronically.  As this is a virtual visit, video technology does not allow for your provider to perform a traditional examination. This may limit your provider's ability to fully assess your condition. If your provider identifies any concerns that need to be evaluated in person or the need to arrange testing (such as labs, EKG, etc.), we will make arrangements to do so. Although advances in technology are sophisticated, we cannot ensure that it will always work on either your end or our end. If the connection with a video visit is poor, the visit may have to be switched to a telephone visit. With either a video or telephone visit, we are not always able to ensure that we have a secure connection.  By engaging in this virtual visit, you consent to the provision of healthcare and authorize for your insurance to be billed (if applicable) for the services provided during this visit. Depending on your insurance coverage, you may receive a charge related to this service.  I need to obtain your verbal consent now. Are you willing to proceed with your visit today? Adam Garza has provided verbal consent on 02/08/2023 for a virtual visit (video or telephone). Freddy Finner, NP  Date: 02/08/2023 9:44 AM  Virtual Visit via Video Note   I, Freddy Finner, connected with  Adam Garza  ([id number], [birth year]) on 02/08/23 at  9:45 AM EDT by a video-enabled telemedicine application and verified that I am speaking with the correct person using two identifiers.  Location: Patient: Virtual Visit Location Patient:  Home Provider: Virtual Visit Location Provider: Home Office   I discussed the limitations of evaluation and management by telemedicine and the availability of in person appointments. The patient expressed understanding and agreed to proceed.    History of Present Illness: Adam Garza is a 42 y.o. who identifies as a male who was assigned male at birth, and is being seen today for diarrhea.  Onset last night around 2 am started with diarrhea- 20-30 times- last had it about 10 mins ago. Made chili- instant pot. Gave him ingestion.  Uses whole foods- eats fiber a lot. Has taken imodium four doses as Was sick a few weeks ago with allergies. 42 year old is sick at home with vomiting- no other symptoms Diarrhea is green, yellow and liquid.   He does not have fevers, chills, no cramping, no blood, but has a history of celiac.  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   Medications:  Current Outpatient Medications:    busPIRone (BUSPAR) 7.5 MG tablet, Take 1 tablet (7.5 mg total) by mouth 2 (two) times daily., Disp:  60 tablet, Rfl: 1   hydrOXYzine (VISTARIL) 25 MG capsule, Take 1 capsule (25 mg total) by mouth every 8 (eight) hours as needed., Disp: 15 capsule, Rfl: 0  Observations/Objective: Patient is well-developed, well-nourished in no acute distress.  Resting comfortably  at home.  Head is normocephalic, atraumatic.  No labored breathing.  Speech is clear and coherent with logical content.  Patient is alert and oriented at baseline.    Assessment  and Plan:  1. Diarrhea, unspecified type  -hydration -imodium as needed -Bland diet -follow up in person if bleeding, fevers, chills or symptoms do not improve  Reviewed side effects, risks and benefits of medication.    Patient acknowledged agreement and understanding of the plan.   Past Medical, Surgical, Social History, Allergies, and Medications have been Reviewed.    Follow Up Instructions: I discussed the assessment and treatment plan with the patient. The patient was provided an opportunity to ask questions and all were answered. The patient agreed with the plan and demonstrated an understanding of the instructions.  A copy of instructions were sent to the patient via MyChart unless otherwise noted below.     The patient was advised to call back or seek an in-person evaluation if the symptoms worsen or if the condition fails to improve as anticipated.  Time:  I spent 10 minutes with the patient via telehealth technology discussing the above problems/concerns.    Freddy Finner, NP

## 2023-07-07 ENCOUNTER — Ambulatory Visit: Payer: BLUE CROSS/BLUE SHIELD | Admitting: Family Medicine

## 2023-07-27 ENCOUNTER — Ambulatory Visit: Payer: BLUE CROSS/BLUE SHIELD | Admitting: Family Medicine

## 2023-07-27 ENCOUNTER — Encounter: Payer: Self-pay | Admitting: Family Medicine

## 2023-07-27 VITALS — BP 118/84 | HR 66 | Temp 98.0°F | Ht 69.0 in | Wt 187.6 lb

## 2023-07-27 DIAGNOSIS — F40243 Fear of flying: Secondary | ICD-10-CM | POA: Diagnosis not present

## 2023-07-27 DIAGNOSIS — E782 Mixed hyperlipidemia: Secondary | ICD-10-CM | POA: Diagnosis not present

## 2023-07-27 DIAGNOSIS — Z23 Encounter for immunization: Secondary | ICD-10-CM

## 2023-07-27 DIAGNOSIS — Z0000 Encounter for general adult medical examination without abnormal findings: Secondary | ICD-10-CM

## 2023-07-27 LAB — CBC WITH DIFFERENTIAL/PLATELET
Basophils Absolute: 0.1 10*3/uL (ref 0.0–0.1)
Basophils Relative: 0.8 % (ref 0.0–3.0)
Eosinophils Absolute: 0.2 10*3/uL (ref 0.0–0.7)
Eosinophils Relative: 2.8 % (ref 0.0–5.0)
HCT: 48.3 % (ref 39.0–52.0)
Hemoglobin: 16.1 g/dL (ref 13.0–17.0)
Lymphocytes Relative: 34.5 % (ref 12.0–46.0)
Lymphs Abs: 2.6 10*3/uL (ref 0.7–4.0)
MCHC: 33.4 g/dL (ref 30.0–36.0)
MCV: 87.5 fL (ref 78.0–100.0)
Monocytes Absolute: 0.6 10*3/uL (ref 0.1–1.0)
Monocytes Relative: 7.9 % (ref 3.0–12.0)
Neutro Abs: 4.1 10*3/uL (ref 1.4–7.7)
Neutrophils Relative %: 54 % (ref 43.0–77.0)
Platelets: 292 10*3/uL (ref 150.0–400.0)
RBC: 5.52 Mil/uL (ref 4.22–5.81)
RDW: 13.7 % (ref 11.5–15.5)
WBC: 7.5 10*3/uL (ref 4.0–10.5)

## 2023-07-27 LAB — URINALYSIS, ROUTINE W REFLEX MICROSCOPIC
Bilirubin Urine: NEGATIVE
Hgb urine dipstick: NEGATIVE
Ketones, ur: NEGATIVE
Leukocytes,Ua: NEGATIVE
Nitrite: NEGATIVE
RBC / HPF: NONE SEEN (ref 0–?)
Specific Gravity, Urine: 1.025 (ref 1.000–1.030)
Total Protein, Urine: NEGATIVE
Urine Glucose: NEGATIVE
Urobilinogen, UA: 0.2 (ref 0.0–1.0)
pH: 6 (ref 5.0–8.0)

## 2023-07-27 LAB — COMPREHENSIVE METABOLIC PANEL
ALT: 21 U/L (ref 0–53)
AST: 24 U/L (ref 0–37)
Albumin: 4.7 g/dL (ref 3.5–5.2)
Alkaline Phosphatase: 101 U/L (ref 39–117)
BUN: 14 mg/dL (ref 6–23)
CO2: 28 meq/L (ref 19–32)
Calcium: 9.2 mg/dL (ref 8.4–10.5)
Chloride: 100 meq/L (ref 96–112)
Creatinine, Ser: 0.91 mg/dL (ref 0.40–1.50)
GFR: 103.98 mL/min (ref >60.00)
Glucose, Bld: 71 mg/dL (ref 70–99)
Potassium: 4.3 meq/L (ref 3.5–5.1)
Sodium: 138 meq/L (ref 135–145)
Total Bilirubin: 0.7 mg/dL (ref 0.2–1.2)
Total Protein: 7.7 g/dL (ref 6.0–8.3)

## 2023-07-27 LAB — LIPID PANEL
Cholesterol: 215 mg/dL — ABNORMAL HIGH (ref 0–200)
HDL: 37.5 mg/dL — ABNORMAL LOW (ref >39.00)
LDL Cholesterol: 113 mg/dL — ABNORMAL HIGH (ref 0–99)
NonHDL: 177.57
Total CHOL/HDL Ratio: 6
Triglycerides: 321 mg/dL — ABNORMAL HIGH (ref 0.0–149.0)
VLDL: 64.2 mg/dL — ABNORMAL HIGH (ref 0.0–40.0)

## 2023-07-27 MED ORDER — ALPRAZOLAM 0.5 MG PO TABS: ORAL_TABLET | ORAL | 0 refills | Status: AC

## 2023-07-27 NOTE — Progress Notes (Signed)
Established Patient Office Visit   Subjective:  Patient ID: Adam Garza, male    DOB: [birth year]  Age: 42 y.o. MRN: [id number]  Chief Complaint  Patient presents with   Annual Exam    CPE: Pt is fasting. Wants to discuss medication Hydroxyzine. Pt wants flu shot.     HPI Encounter Diagnoses  Name Primary?   Healthcare maintenance Yes   Elevated triglycerides with high cholesterol    Need for immunization against influenza    Fear of flying    For follow-up of above and physical exam.  Doing well.  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  He has 20 flights yearly.  Extreme fear of flying.  Hydroxyzine and BuSpar have not been helpful.  He is exercising regularly and has been able to lose some weight.  He is very careful with his diet to avoid fat and cholesterol.   Review of Systems  Constitutional: Negative.   HENT: Negative.    Eyes:  Negative for blurred vision, discharge and redness.  Respiratory: Negative.    Cardiovascular: Negative.   Gastrointestinal:  Negative for abdominal pain.  Genitourinary: Negative.   Musculoskeletal: Negative.  Negative for myalgias.  Skin:  Negative for rash.  Neurological:  Negative for tingling, loss of consciousness and weakness.  Endo/Heme/Allergies:  Negative for polydipsia.     Current Outpatient Medications:    ALPRAZolam (XANAX) 0.5 MG tablet, May take 1 before flying., Disp: 30 tablet, Rfl: 0   hydrOXYzine (VISTARIL) 25 MG capsule, Take 1 capsule (25 mg total) by mouth every 8 (eight) hours as needed., Disp: 15 capsule, Rfl: 0   busPIRone (BUSPAR) 7.5 MG tablet, Take 1 tablet (7.5 mg total) by mouth 2 (two) times daily., Disp: 60 tablet, Rfl: 1   Objective:     BP 118/84   Pulse 66   Temp 98 F (36.7 C)   Ht 5\' 9"  (1.753 m)   Wt 187 lb 9.6 oz (85.1 kg)   SpO2 98%   BMI 27.70 kg/m    Physical Exam Constitutional:      General: He is not in acute distress.    Appearance: Normal appearance. He is  not ill-appearing, toxic-appearing or diaphoretic.  HENT:     Head: Normocephalic and atraumatic.     Right Ear: External ear normal.     Left Ear: External ear normal.     Mouth/Throat:     Mouth: Mucous membranes are moist.     Pharynx: Oropharynx is clear. No oropharyngeal exudate or posterior oropharyngeal erythema.  Eyes:     General: No scleral icterus.       Right eye: No discharge.        Left eye: No discharge.     Extraocular Movements: Extraocular movements intact.     Conjunctiva/sclera: Conjunctivae normal.     Pupils: Pupils are equal, round, and reactive to light.  Cardiovascular:     Rate and Rhythm: Normal rate and regular rhythm.  Pulmonary:     Effort: Pulmonary effort is normal. No respiratory distress.     Breath sounds: Normal breath sounds.  Abdominal:     General: Bowel sounds are normal.     Tenderness: There is no abdominal tenderness. There is no guarding.  Musculoskeletal:     Cervical back: No rigidity or tenderness.  Skin:    General: Skin is warm and dry.  Neurological:     Mental Status: He is alert and oriented to person,  place, and time.  Psychiatric:        Mood and Affect: Mood normal.        Behavior: Behavior normal.      No results found for any visits on 07/27/23.    The 10-year ASCVD risk score (Arnett DK, et al., 2019) is: 1.9%    Assessment & Plan:   Healthcare maintenance -     CBC with Differential/Platelet -     Urinalysis, Routine w reflex microscopic  Elevated triglycerides with high cholesterol -     Comprehensive metabolic panel -     Lipid panel -     CT CARDIAC SCORING (SELF PAY ONLY); Future  Need for immunization against influenza -     Flu vaccine trivalent PF, 6mos and older(Flulaval,Afluria,Fluarix,Fluzone)  Fear of flying -     ALPRAZolam; May take 1 before flying.  Dispense: 30 tablet; Refill: 0    Return in about 1 year (around 07/26/2024), or if symptoms worsen or fail to improve.  Xanax for as  needed use for flights.  Will try fish oil for triglycerides.  Agrees to go for coronary artery calcium scoring.  Mliss Sax, MD

## 2023-09-13 ENCOUNTER — Encounter: Payer: Self-pay | Admitting: Family Medicine

## 2023-09-20 ENCOUNTER — Encounter: Payer: Self-pay | Admitting: Podiatry

## 2023-09-20 ENCOUNTER — Ambulatory Visit: Payer: BLUE CROSS/BLUE SHIELD | Admitting: Podiatry

## 2023-09-20 DIAGNOSIS — M779 Enthesopathy, unspecified: Secondary | ICD-10-CM | POA: Diagnosis not present

## 2023-09-20 DIAGNOSIS — Q666 Other congenital valgus deformities of feet: Secondary | ICD-10-CM | POA: Diagnosis not present

## 2023-09-20 DIAGNOSIS — M7750 Other enthesopathy of unspecified foot: Secondary | ICD-10-CM

## 2023-09-20 NOTE — Progress Notes (Signed)
Subjective:   Patient ID: Adam Garza, male   DOB: 42 y.o.   MRN: [id number]   HPI Chief Complaint  Patient presents with   Foot Pain    RM#11 Patient needs new orthotics.    42 year old male presents the office today requesting new orthotics.  He has history of flatfeet and is very active and without the use of orthotics he gets pain but currently doing well but his orthotics are about.  He has a history of a capsulitis, tendinitis given his left foot.   Review of Systems  All other systems reviewed and are negative.  History reviewed. No pertinent past medical history.  History reviewed. No pertinent surgical history.   Current Outpatient Medications:    ALPRAZolam (XANAX) 0.5 MG tablet, May take 1 before flying., Disp: 30 tablet, Rfl: 0  Allergies  Allergen Reactions   Penicillins     Had reaction and 42 years old, unknown reaction.           Objective:  Physical Exam  General: AAO x3, NAD  Dermatological: Minimal callus formation on medial aspects of the arches bilaterally as the calluses are coming from the orthotics.  There is no skin breakdown or ulcerations present.  Vascular: Dorsalis Pedis artery and Posterior Tibial artery pedal pulses are 2/4 bilateral with immedate capillary fill time. There is no pain with calf compression, swelling, warmth, erythema.   Neruologic: Grossly intact via light touch bilateral.  Musculoskeletal: Decreased medial arch upon weightbearing.  Ankle, subtalar joint range of motion intact.  There is no area pinpoint tenderness identified at this time.  MMT/5.  Gait: Unassisted, Nonantalgic.       Assessment:   Pes planovalgus     Plan:  -Treatment options discussed including all alternatives, risks, and complications -Etiology of symptoms were discussed -He is doing a little rifaximin to continue with this.  He is measured for new orthotics today by Nicki Guadalajara, pedorthist.  Discussed supportive shoe gear as well.  Vivi Barrack DPM

## 2023-09-21 ENCOUNTER — Ambulatory Visit (HOSPITAL_BASED_OUTPATIENT_CLINIC_OR_DEPARTMENT_OTHER)
Admission: RE | Admit: 2023-09-21 | Discharge: 2023-09-21 | Disposition: A | Discharge status: Home / Self Care | Payer: BLUE CROSS/BLUE SHIELD | Source: Ambulatory Visit | Attending: Family Medicine | Admitting: Family Medicine

## 2023-09-21 DIAGNOSIS — E782 Mixed hyperlipidemia: Secondary | ICD-10-CM | POA: Insufficient documentation

## 2023-10-03 ENCOUNTER — Encounter: Payer: Self-pay | Admitting: Family Medicine

## 2023-10-04 ENCOUNTER — Telehealth: Payer: Self-pay

## 2023-10-04 NOTE — Telephone Encounter (Signed)
LEFT VM TO SCHEDULE ORTHOTIC P/U

## 2023-10-14 ENCOUNTER — Ambulatory Visit: Payer: BLUE CROSS/BLUE SHIELD

## 2023-10-14 NOTE — Progress Notes (Signed)
Patient presents today to pick up custom molded foot orthotics, diagnosed with Pes planovalgus by Dr. Ardelle Anton.   Orthotics were dispensed and fit was satisfactory. Reviewed instructions for break-in and wear. Written instructions given to patient.  Patient will follow up as needed.   [name] Cped CFo, CFm

## 2024-05-23 ENCOUNTER — Encounter: Payer: Self-pay | Admitting: Family Medicine

## 2024-06-04 ENCOUNTER — Encounter: Payer: Self-pay | Admitting: Family Medicine

## 2024-06-04 ENCOUNTER — Ambulatory Visit (INDEPENDENT_AMBULATORY_CARE_PROVIDER_SITE_OTHER): Admitting: Family Medicine

## 2024-06-04 VITALS — BP 110/76 | HR 93 | Temp 98.6°F | Ht 69.0 in | Wt 201.0 lb

## 2024-06-04 DIAGNOSIS — M7701 Medial epicondylitis, right elbow: Secondary | ICD-10-CM | POA: Diagnosis not present

## 2024-06-04 DIAGNOSIS — M7711 Lateral epicondylitis, right elbow: Secondary | ICD-10-CM | POA: Diagnosis not present

## 2024-06-04 DIAGNOSIS — E782 Mixed hyperlipidemia: Secondary | ICD-10-CM

## 2024-06-04 NOTE — Progress Notes (Signed)
 Established Patient Office Visit   Subjective:  Patient ID: Adam Garza, male    DOB: 1980-11-22  Age: 43 y.o. MRN: [id number]  Chief Complaint  Patient presents with   Arm Pain    Pt complains of right elbow pain x 3-4 months. NO edema, redness or decrease range of motion. No current treatment. Would like to be referred to a sports med doctor.     Arm Pain  Pertinent negatives include no tingling.   Encounter Diagnoses  Name Primary?   Lateral epicondylitis of right elbow Yes   Medial epicondylitis of right elbow    Elevated triglycerides with high cholesterol    2 to 59-month history of right elbow pain.  RHD.  No specific injury.  He exercises by lifting weights and swimming.  Pain has been dull and intermittent.  He has done forearm curls for years and recently increased his weight 5 months ago.  Elevated triglycerides, LDL of 113 with an HDL of 37 with a coronary artery calcium score of 0   Review of Systems  Constitutional: Negative.   HENT: Negative.    Eyes:  Negative for blurred vision, discharge and redness.  Respiratory: Negative.    Cardiovascular: Negative.   Gastrointestinal:  Negative for abdominal pain.  Genitourinary: Negative.   Musculoskeletal:  Positive for joint pain. Negative for myalgias.  Skin:  Negative for rash.  Neurological:  Negative for tingling, loss of consciousness and weakness.  Endo/Heme/Allergies:  Negative for polydipsia.     Current Outpatient Medications:    ALPRAZolam  (XANAX ) 0.5 MG tablet, May take 1 before flying., Disp: 30 tablet, Rfl: 0   Objective:     BP 110/76 (BP Location: Left Arm, Patient Position: Sitting, Cuff Size: Large)   Pulse 93   Temp 98.6 F (37 C) (Temporal)   Ht 5' 9 (1.753 m)   Wt 201 lb (91.2 kg)   SpO2 98%   BMI 29.68 kg/m    Physical Exam Constitutional:      General: He is not in acute distress.    Appearance: Normal appearance. He is not ill-appearing, toxic-appearing or diaphoretic.   HENT:     Head: Normocephalic and atraumatic.     Right Ear: External ear normal.     Left Ear: External ear normal.  Eyes:     General: No scleral icterus.       Right eye: No discharge.        Left eye: No discharge.     Extraocular Movements: Extraocular movements intact.     Conjunctiva/sclera: Conjunctivae normal.  Pulmonary:     Effort: Pulmonary effort is normal. No respiratory distress.  Musculoskeletal:     Right elbow: No swelling or deformity. Normal range of motion. Tenderness present.       Arms:  Skin:    General: Skin is warm and dry.  Neurological:     Mental Status: He is alert and oriented to person, place, and time.  Psychiatric:        Mood and Affect: Mood normal.        Behavior: Behavior normal.      No results found for any visits on 06/04/24.    The 10-year ASCVD risk score (Arnett DK, et al., 2019) is: 2.1%    Assessment & Plan:   Lateral epicondylitis of right elbow -     Ambulatory referral to Sports Medicine  Medial epicondylitis of right elbow -     Ambulatory referral to Sports Medicine  Elevated triglycerides with high cholesterol    Return if symptoms worsen or fail to improve.  Information given on lateral and medial epicondylitis.  Information was given on preventing high cholesterol.  Elsie Sim Lent, MD

## 2024-06-20 NOTE — Progress Notes (Unsigned)
 Adam Jackson D.[facility] [address] [place] [address] Phone: [phone]   Assessment and Plan:     1. Right elbow pain (Primary) 2. Medial epicondylitis of right elbow 3. 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  My interpretation: No acute fracture or dislocation - Start HEP for medial and lateral epicondylitis - Start meloxicam  15 mg daily x2 weeks.  If still having pain after 2 weeks, complete 3rd-week of NSAID. May use remaining NSAID as needed once daily for pain control.  Do not to use additional over-the-counter NSAIDs (ibuprofen, naproxen, Advil, Aleve, etc.) while taking prescription NSAIDs.  May use Tylenol  [phone] mg 2 to 3 times a day for breakthrough pain. - Recommend using tennis elbow strap when physically active to decrease strain over elbow - Recommend decreasing weight and repetitions in upper extremity lifting for 3 to 4 weeks and then returning to physical activity as tolerated  15 additional minutes spent for educating Therapeutic Home Exercise Program.  This included exercises focusing on stretching, strengthening, with focus on eccentric aspects.   Long term goals include an improvement in range of motion, strength, endurance as well as avoiding reinjury. Patient's frequency would include in 1-2 times a day, 3-5 times a week for a duration of 6-12 weeks. Proper technique shown and discussed handout in great detail with ATC.  All questions were discussed and answered.     Pertinent previous records reviewed include none   Follow Up: As needed if no improvement or worsening of symptoms.  Could consider wrist brace versus repeat NSAID course versus needling versus physical therapy   Subjective:   I, Shafter Jupin, am serving as a Neurosurgeon for Doctor Morene Mace  Chief Complaint: right elbow pain   HPI:   06/21/2024 Patient is a 43 year old male with right elbow pain. Patient states pain since April intermittent pain. Decreased ROM. Pain has decreased since being seen by PCP. Pain was medial and lateral elbow, pain radiated down his arm to his hand. No meds for the pain. No numbness or tingling. No decrease in grip strength now. No MOI.    Relevant Historical Information: None pertinent  Additional pertinent review of systems negative.   Current Outpatient Medications:    meloxicam  (MOBIC ) 15 MG tablet, Take 1 tablet (15 mg total) by mouth daily., Disp: 30 tablet, Rfl: 0   ALPRAZolam  (XANAX ) 0.5 MG tablet, May take 1 before flying., Disp: 30 tablet, Rfl: 0   Objective:     Vitals:   06/21/24 1528  Pulse: 81  SpO2: 95%  Weight: 193 lb (87.5 kg)  Height: 5' 9 (1.753 m)      Body mass index is 28.5 kg/m.    Physical Exam:    General: Appears well, no acute distress, nontoxic and pleasant Neck: FROM, no pain Neuro: sensation is intact distally with no deficits, strenghth is 5/5 in elbow flexors/extenders/supinator/pronators and wrist flexors/extensors Psych: no evidence of anxiety or depression  Right elbow: No deformity, swelling or muscle wasting Normal Carrying angle ROM:0-140, supination and pronation 90 TTP medial and lateral epicondyle, supinator and pronators NTTP over triceps, ticeps tendon, olecranon,  antecubital fossa, biceps tendon,   Positive tinnels over cubital tunnel   pain with resisted wrist and middle digit extension   pain with resisted wrist flexion   pain with resisted supination  pain with resisted pronation Negative valgus stress Negative varus stress Negative milking maneuver    Electronically signed by:  Odis Mace D[facility] 3:52 PM 06/21/24

## 2024-06-21 ENCOUNTER — Ambulatory Visit (INDEPENDENT_AMBULATORY_CARE_PROVIDER_SITE_OTHER)

## 2024-06-21 ENCOUNTER — Ambulatory Visit (INDEPENDENT_AMBULATORY_CARE_PROVIDER_SITE_OTHER): Admitting: Sports Medicine

## 2024-06-21 VITALS — HR 81 | Ht 69.0 in | Wt 193.0 lb

## 2024-06-21 DIAGNOSIS — M25521 Pain in right elbow: Secondary | ICD-10-CM

## 2024-06-21 DIAGNOSIS — M7711 Lateral epicondylitis, right elbow: Secondary | ICD-10-CM | POA: Diagnosis not present

## 2024-06-21 DIAGNOSIS — M7701 Medial epicondylitis, right elbow: Secondary | ICD-10-CM

## 2024-06-21 MED ORDER — MELOXICAM 15 MG PO TABS: 15.0000 mg | ORAL_TABLET | Freq: Every day | ORAL | 0 refills | Status: DC

## 2024-06-21 NOTE — Patient Instructions (Addendum)
-   Start meloxicam  15 mg daily x2 weeks.  If still having pain after 2 weeks, complete 3rd-week of NSAID. May use remaining NSAID as needed once daily for pain control.  Do not to use additional over-the-counter NSAIDs (ibuprofen, naproxen, Advil, Aleve, etc.) while taking prescription NSAIDs.  May use Tylenol  [phone] mg 2 to 3 times a day for breakthrough pain.  Elbow HEP   Tennis elbow strap when physically active   Decreased weight and repetitions when upper body lifting for the next 2-3 weeks   As needed follow up

## 2024-07-03 ENCOUNTER — Ambulatory Visit: Payer: Self-pay | Admitting: Sports Medicine

## 2024-07-18 ENCOUNTER — Encounter: Payer: Self-pay | Admitting: Family Medicine

## 2024-07-18 NOTE — Telephone Encounter (Signed)
Pt requesting referral to dermatology

## 2024-07-31 ENCOUNTER — Encounter: Payer: BLUE CROSS/BLUE SHIELD | Admitting: Family Medicine

## 2024-08-30 ENCOUNTER — Ambulatory Visit (INDEPENDENT_AMBULATORY_CARE_PROVIDER_SITE_OTHER): Admitting: Family Medicine

## 2024-08-30 VITALS — BP 110/72 | HR 79 | Temp 98.0°F | Ht 69.0 in | Wt 198.0 lb

## 2024-08-30 DIAGNOSIS — L700 Acne vulgaris: Secondary | ICD-10-CM

## 2024-08-30 DIAGNOSIS — F40243 Fear of flying: Secondary | ICD-10-CM

## 2024-08-30 DIAGNOSIS — E782 Mixed hyperlipidemia: Secondary | ICD-10-CM | POA: Diagnosis not present

## 2024-08-30 DIAGNOSIS — Z0000 Encounter for general adult medical examination without abnormal findings: Secondary | ICD-10-CM

## 2024-08-30 DIAGNOSIS — Z131 Encounter for screening for diabetes mellitus: Secondary | ICD-10-CM

## 2024-08-30 DIAGNOSIS — F419 Anxiety disorder, unspecified: Secondary | ICD-10-CM | POA: Insufficient documentation

## 2024-08-30 NOTE — Progress Notes (Signed)
 Established Patient Office Visit   Subjective:  Patient ID: Adam Garza, male    DOB: October 07, 1981  Age: 43 y.o. MRN: [id number]  Chief Complaint  Patient presents with   Annual Exam    CPE    HPI Encounter Diagnoses  Name Primary?   Healthcare maintenance Yes   Elevated triglycerides with high cholesterol    Fear of flying    Screening for diabetes mellitus    Anxiety    Acne vulgaris   Here for physical.  Nonfasting.  Remains quite active physically.  Remains active and Halloween.  Continues to travel frequently for spooked houses around the country and sometimes around the world.  Currently taking 1 mg of Xanax  for flights for his fear of flying.  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  History of acne treated with clindamycin gel.  Has been taking fish oil for his elevated triglycerides.  Has avoided animal fat in his diet.  He monitors his caloric intake closely with an app on his phone.  Just finished his Halloween season and frequent travel.  Admits that his diet was not the best out on the road.  He does not drink alcohol.      Review of Systems  Constitutional: Negative.   HENT: Negative.    Eyes:  Negative for blurred vision, discharge and redness.  Respiratory: Negative.    Cardiovascular: Negative.   Gastrointestinal:  Negative for abdominal pain.  Genitourinary: Negative.   Musculoskeletal: Negative.  Negative for myalgias.  Skin:  Negative for rash.  Neurological:  Negative for tingling, loss of consciousness and weakness.  Endo/Heme/Allergies:  Negative for polydipsia.     Current Outpatient Medications:    ALPRAZolam  (XANAX ) 0.5 MG tablet, May take 1 before flying., Disp: 30 tablet, Rfl: 0   Objective:     BP 110/72 (BP Location: Right Arm, Patient Position: Sitting, Cuff Size: Normal)   Pulse 79   Temp 98 F (36.7 C) (Temporal)   Ht 5' 9 (1.753 m)   Wt 198 lb (89.8 kg)   SpO2 96%   BMI 29.24 kg/m    Physical  Exam Constitutional:      General: He is not in acute distress.    Appearance: Normal appearance. He is not ill-appearing, toxic-appearing or diaphoretic.  HENT:     Head: Normocephalic and atraumatic.     Right Ear: Tympanic membrane, ear canal and external ear normal.     Left Ear: Tympanic membrane, ear canal and external ear normal.     Mouth/Throat:     Mouth: Mucous membranes are moist.     Pharynx: Oropharynx is clear. No oropharyngeal exudate or posterior oropharyngeal erythema.  Eyes:     General: No scleral icterus.       Right eye: No discharge.        Left eye: No discharge.     Extraocular Movements: Extraocular movements intact.     Conjunctiva/sclera: Conjunctivae normal.     Pupils: Pupils are equal, round, and reactive to light.  Cardiovascular:     Rate and Rhythm: Normal rate and regular rhythm.  Pulmonary:     Effort: Pulmonary effort is normal. No respiratory distress.     Breath sounds: Normal breath sounds.  Abdominal:     General: Bowel sounds are normal.     Tenderness: There is no abdominal tenderness. There is no guarding.     Hernia: There is no hernia in the left inguinal area or right inguinal  area.  Genitourinary:    Penis: Circumcised. No hypospadias, erythema, tenderness, discharge, swelling or lesions.      Testes:        Right: Mass, tenderness or swelling not present. Right testis is descended.        Left: Mass, tenderness or swelling not present. Left testis is descended.     Epididymis:     Right: Not inflamed or enlarged.     Left: Not inflamed or enlarged.  Musculoskeletal:     Cervical back: No rigidity or tenderness.  Lymphadenopathy:     Lower Body: No right inguinal adenopathy. No left inguinal adenopathy.  Skin:    General: Skin is warm and dry.      Neurological:     Mental Status: He is alert and oriented to person, place, and time.  Psychiatric:        Mood and Affect: Mood normal.        Behavior: Behavior normal.       No results found for any visits on 08/30/24.    The 10-year ASCVD risk score (Arnett DK, et al., 2019) is: 2.1%    Assessment & Plan:   Healthcare maintenance -     Urinalysis, Routine w reflex microscopic; Future -     CBC with Differential/Platelet; Future  Elevated triglycerides with high cholesterol -     Comprehensive metabolic panel with GFR; Future -     Lipid panel; Future -     TSH; Future  Fear of flying  Screening for diabetes mellitus -     Comprehensive metabolic panel with GFR; Future -     Hemoglobin A1c; Future  Anxiety -     TSH; Future  Acne vulgaris -     Ambulatory referral to Dermatology    Return in about 6 months (around 02/27/2025), or Return fasting for ordered blood work., for chronic disease follow-up.  Information given on health maintenance and disease prevention.  Information given on dyslipidemia.  Discussed common risks of elevated triglycerides to include pancreatitis and fatty liver disease.  Continue regular exercise and low-fat diet.  Information was given on fenofibrate.  Remain concerned about his triglycerides.  They have been significantly elevated even allowing for a suboptimal diet.  Elsie Sim Lent, MD

## 2024-08-31 ENCOUNTER — Other Ambulatory Visit (INDEPENDENT_AMBULATORY_CARE_PROVIDER_SITE_OTHER)

## 2024-08-31 DIAGNOSIS — E782 Mixed hyperlipidemia: Secondary | ICD-10-CM | POA: Diagnosis not present

## 2024-08-31 DIAGNOSIS — F419 Anxiety disorder, unspecified: Secondary | ICD-10-CM

## 2024-08-31 DIAGNOSIS — Z131 Encounter for screening for diabetes mellitus: Secondary | ICD-10-CM

## 2024-08-31 DIAGNOSIS — Z0000 Encounter for general adult medical examination without abnormal findings: Secondary | ICD-10-CM | POA: Diagnosis not present

## 2024-08-31 LAB — TSH: TSH: 1.47 u[IU]/mL (ref 0.35–5.50)

## 2024-08-31 LAB — COMPREHENSIVE METABOLIC PANEL WITH GFR
ALT: 20 U/L (ref 0–53)
AST: 22 U/L (ref 0–37)
Albumin: 4.5 g/dL (ref 3.5–5.2)
Alkaline Phosphatase: 70 U/L (ref 39–117)
BUN: 20 mg/dL (ref 6–23)
CO2: 28 meq/L (ref 19–32)
Calcium: 9 mg/dL (ref 8.4–10.5)
Chloride: 100 meq/L (ref 96–112)
Creatinine, Ser: 0.95 mg/dL (ref 0.40–1.50)
GFR: 97.99 mL/min (ref >60.00)
Glucose, Bld: 93 mg/dL (ref 70–99)
Potassium: 4.2 meq/L (ref 3.5–5.1)
Sodium: 136 meq/L (ref 135–145)
Total Bilirubin: 0.6 mg/dL (ref 0.2–1.2)
Total Protein: 7.6 g/dL (ref 6.0–8.3)

## 2024-08-31 LAB — LIPID PANEL
Cholesterol: 206 mg/dL — ABNORMAL HIGH (ref 0–200)
HDL: 34.9 mg/dL — ABNORMAL LOW (ref >39.00)
LDL Cholesterol: 134 mg/dL — ABNORMAL HIGH (ref 0–99)
NonHDL: 171.1
Total CHOL/HDL Ratio: 6
Triglycerides: 186 mg/dL — ABNORMAL HIGH (ref 0.0–149.0)
VLDL: 37.2 mg/dL (ref 0.0–40.0)

## 2024-08-31 LAB — CBC WITH DIFFERENTIAL/PLATELET
Basophils Absolute: 0.1 K/uL (ref 0.0–0.1)
Basophils Relative: 0.8 % (ref 0.0–3.0)
Eosinophils Absolute: 0.2 K/uL (ref 0.0–0.7)
Eosinophils Relative: 2.5 % (ref 0.0–5.0)
HCT: 47.7 % (ref 39.0–52.0)
Hemoglobin: 16.3 g/dL (ref 13.0–17.0)
Lymphocytes Relative: 36.6 % (ref 12.0–46.0)
Lymphs Abs: 2.5 K/uL (ref 0.7–4.0)
MCHC: 34.1 g/dL (ref 30.0–36.0)
MCV: 87 fl (ref 78.0–100.0)
Monocytes Absolute: 0.8 K/uL (ref 0.1–1.0)
Monocytes Relative: 12.4 % — ABNORMAL HIGH (ref 3.0–12.0)
Neutro Abs: 3.2 K/uL (ref 1.4–7.7)
Neutrophils Relative %: 47.7 % (ref 43.0–77.0)
Platelets: 292 K/uL (ref 150.0–400.0)
RBC: 5.48 Mil/uL (ref 4.22–5.81)
RDW: 13.8 % (ref 11.5–15.5)
WBC: 6.8 K/uL (ref 4.0–10.5)

## 2024-08-31 LAB — HEMOGLOBIN A1C: Hgb A1c MFr Bld: 5.1 % (ref 4.6–6.5)

## 2024-08-31 NOTE — Addendum Note (Signed)
 Addended by: BERNETA FALLOW A on: 08/31/2024 10:40 AM   Modules accepted: Orders

## 2024-09-03 ENCOUNTER — Ambulatory Visit: Payer: Self-pay | Admitting: Family Medicine

## 2024-09-03 LAB — URINALYSIS, ROUTINE W REFLEX MICROSCOPIC
Bilirubin Urine: NEGATIVE
Hgb urine dipstick: NEGATIVE
Ketones, ur: NEGATIVE
Leukocytes,Ua: NEGATIVE
Nitrite: NEGATIVE
RBC / HPF: NONE SEEN (ref 0–?)
Specific Gravity, Urine: 1.015 (ref 1.000–1.030)
Total Protein, Urine: NEGATIVE
Urine Glucose: NEGATIVE
Urobilinogen, UA: 0.2 (ref 0.0–1.0)
WBC, UA: NONE SEEN (ref 0–?)
pH: 6.5 (ref 5.0–8.0)

## 2024-09-04 ENCOUNTER — Encounter: Payer: Self-pay | Admitting: Family Medicine

## 2024-09-24 ENCOUNTER — Encounter: Payer: Self-pay | Admitting: Podiatry

## 2024-09-24 ENCOUNTER — Telehealth: Payer: Self-pay | Admitting: Podiatry

## 2024-09-24 NOTE — Telephone Encounter (Signed)
 Patient called in regards to orthotics. I let him know that because it has been over a year since he has been seen, he would need to schedule an appointment with his provider before he is able to schedule an appointment for orthotics and he would like to speak with someone in regards to this.

## 2024-09-25 NOTE — Telephone Encounter (Signed)
 Can we go ahead and reorder another pair for this patient, please? Thanks!

## 2024-09-27 ENCOUNTER — Telehealth: Payer: Self-pay

## 2024-09-27 DIAGNOSIS — L700 Acne vulgaris: Secondary | ICD-10-CM

## 2024-09-27 NOTE — Telephone Encounter (Signed)
 Copied from CRM #[id number]. Topic: Clinical - Medication Question >> Sep 27, 2024 11:40 AM Drema MATSU wrote: Reason for CRM: Patient wants his two prescriptions filled from his old dermatologist. Records are in patient chart. He is switching over to [facility] but next appointment isn't until June. He wants pcp to fill it until he can get in. Please call patient before filling.   Clindamycin Phosphate Topical Lotion 1% 60 ml bottle Tretinoin Cream USP 0.1% 45g

## 2024-10-01 MED ORDER — TRETINOIN 0.1 % EX CREA: TOPICAL_CREAM | Freq: Every day | CUTANEOUS | 2 refills | Status: AC

## 2024-10-01 MED ORDER — CLINDAMYCIN PHOS (TWICE-DAILY) 1 % EX GEL: Freq: Two times a day (BID) | CUTANEOUS | 2 refills | Status: AC

## 2024-10-01 NOTE — Addendum Note (Signed)
 Addended by: BERNETA ELSIE LABOR on: 10/01/2024 08:20 AM   Modules accepted: Orders

## 2024-10-12 NOTE — Telephone Encounter (Signed)
 Pt has appt to see Dr. Scherrie 10/15/24

## 2024-10-15 ENCOUNTER — Ambulatory Visit

## 2024-10-15 DIAGNOSIS — Q666 Other congenital valgus deformities of feet: Secondary | ICD-10-CM | POA: Diagnosis not present

## 2024-10-15 NOTE — Progress Notes (Signed)
 ORTHOTIC DISPENSING:   Reason for Visit:         Fitting and Delivery of Custom Fabricated Foot Orthoses Patient Report:            Patient reports comfort and is satisfied with device.   OBJECTIVE DATA: Patient History / Diagnosis:    No change in pathology Provided Device:                     Functional foot orthoses   GOAL OF ORTHOSIS - Improve gait - Decrease energy expenditure - Improve Balance - Provide Triplanar stability of foot complex - Facilitate motion   ACTIONS PERFORMED Orthotics dispensed-  he has had orthotics in the past and knows how to use.     He will call if any questions, concerns or if orthotic adjustments need to be made.   Glenyce Randle, DPM

## 2024-10-30 ENCOUNTER — Encounter: Admitting: Family Medicine

## 2025-02-28 ENCOUNTER — Ambulatory Visit: Admitting: Family Medicine

## 2025-04-10 ENCOUNTER — Ambulatory Visit: Admitting: Physician Assistant
